# Patient Record
Sex: Female | Born: 1995 | Race: Black or African American | Hispanic: No | Marital: Single | Smoking: Current every day smoker
Health system: Southern US, Community
[De-identification: ages and names within clinical notes are randomized; demographics above are authoritative.]

## PROBLEM LIST (undated history)

## (undated) DIAGNOSIS — G43909 Migraine, unspecified, not intractable, without status migrainosus: Secondary | ICD-10-CM

## (undated) DIAGNOSIS — F419 Anxiety disorder, unspecified: Secondary | ICD-10-CM

## (undated) DIAGNOSIS — M419 Scoliosis, unspecified: Secondary | ICD-10-CM

## (undated) DIAGNOSIS — F32A Depression, unspecified: Secondary | ICD-10-CM

## (undated) HISTORY — PX: NO PAST SURGERIES: SHX2092

## (undated) HISTORY — DX: Migraine, unspecified, not intractable, without status migrainosus: G43.909

## (undated) HISTORY — DX: Depression, unspecified: F32.A

## (undated) HISTORY — DX: Anxiety disorder, unspecified: F41.9

## (undated) HISTORY — DX: Scoliosis, unspecified: M41.9

---
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
## 2014-07-07 ENCOUNTER — Emergency Department: Payer: Self-pay | Admitting: Emergency Medicine

## 2014-09-06 ENCOUNTER — Emergency Department: Payer: Self-pay | Admitting: Student

## 2014-10-23 ENCOUNTER — Emergency Department: Payer: Self-pay | Admitting: Emergency Medicine

## 2014-10-25 LAB — BETA STREP CULTURE(ARMC)

## 2015-01-17 ENCOUNTER — Emergency Department: Payer: Self-pay | Admitting: Emergency Medicine

## 2015-01-17 LAB — CBC WITH DIFFERENTIAL/PLATELET
Basophil #: 0 x10 3/mm 3
Basophil %: 0.4 %
Eosinophil #: 0.1 x10 3/mm 3
Eosinophil %: 1.2 %
HCT: 38.6 %
HGB: 12.5 g/dL
Lymphocyte %: 10.3 %
Lymphs Abs: 0.9 x10 3/mm 3 — ABNORMAL LOW
MCH: 27.1 pg
MCHC: 32.3 g/dL
MCV: 84 fL
Monocyte #: 0.3 "x10 3/mm "
Monocyte %: 3.1 %
Neutrophil #: 7.7 x10 3/mm 3 — ABNORMAL HIGH
Neutrophil %: 85 %
Platelet: 360 x10 3/mm 3
RBC: 4.61 X10 6/mm 3
RDW: 15.4 % — ABNORMAL HIGH
WBC: 9.1 x10 3/mm 3

## 2015-01-17 LAB — COMPREHENSIVE METABOLIC PANEL
ALT: 18 U/L (ref 14–63)
AST: 25 U/L (ref 0–26)
Albumin: 4.3 g/dL (ref 3.8–5.6)
Alkaline Phosphatase: 91 U/L (ref 46–116)
Anion Gap: 7 (ref 7–16)
BUN: 8 mg/dL (ref 7–18)
Bilirubin,Total: 0.2 mg/dL (ref 0.2–1.0)
CHLORIDE: 104 mmol/L (ref 98–107)
Calcium, Total: 9.3 mg/dL (ref 9.0–10.7)
Co2: 26 mmol/L (ref 21–32)
Creatinine: 0.85 mg/dL (ref 0.60–1.30)
GLUCOSE: 88 mg/dL (ref 65–99)
Osmolality: 272 (ref 275–301)
Potassium: 3.9 mmol/L (ref 3.5–5.1)
Sodium: 137 mmol/L (ref 136–145)
TOTAL PROTEIN: 8.6 g/dL (ref 6.4–8.6)

## 2015-01-17 LAB — URINALYSIS, COMPLETE
Bacteria: NONE SEEN
Bilirubin,UR: NEGATIVE
Glucose,UR: NEGATIVE mg/dL
Ketone: NEGATIVE
Leukocyte Esterase: NEGATIVE
Nitrite: NEGATIVE
Ph: 6
Protein: NEGATIVE
RBC,UR: 4 /HPF
Specific Gravity: 1.027
Squamous Epithelial: 1
WBC UR: 2 /HPF

## 2015-04-08 ENCOUNTER — Emergency Department
Admit: 2015-04-08 | Disposition: A | Discharge status: Home / Self Care | Payer: Self-pay | Admitting: Emergency Medicine

## 2015-04-08 LAB — COMPREHENSIVE METABOLIC PANEL
ALBUMIN: 4.2 g/dL
ANION GAP: 10 (ref 7–16)
Alkaline Phosphatase: 65 U/L
BUN: 8 mg/dL
Bilirubin,Total: 0.5 mg/dL
CALCIUM: 9.2 mg/dL
CHLORIDE: 101 mmol/L
CREATININE: 0.87 mg/dL
Co2: 26 mmol/L
Glucose: 155 mg/dL — ABNORMAL HIGH
POTASSIUM: 3.6 mmol/L
SGOT(AST): 18 U/L
SGPT (ALT): 10 U/L — ABNORMAL LOW
Sodium: 137 mmol/L
Total Protein: 8.4 g/dL — ABNORMAL HIGH

## 2015-04-08 LAB — CBC
HCT: 38.1 % (ref 35.0–47.0)
HGB: 12.2 g/dL (ref 12.0–16.0)
MCH: 26.8 pg (ref 26.0–34.0)
MCHC: 32 g/dL (ref 32.0–36.0)
MCV: 84 fL (ref 80–100)
PLATELETS: 332 10*3/uL (ref 150–440)
RBC: 4.54 10*6/uL (ref 3.80–5.20)
RDW: 14.8 % — ABNORMAL HIGH (ref 11.5–14.5)
WBC: 13.2 10*3/uL — ABNORMAL HIGH (ref 3.6–11.0)

## 2015-04-08 LAB — URINALYSIS, COMPLETE
BILIRUBIN, UR: NEGATIVE
Bacteria: NONE SEEN
Glucose,UR: NEGATIVE mg/dL (ref 0–75)
Nitrite: NEGATIVE
PH: 6 (ref 4.5–8.0)
Protein: 30
SPECIFIC GRAVITY: 1.017 (ref 1.003–1.030)

## 2015-04-08 LAB — HCG, QUANTITATIVE, PREGNANCY: Beta Hcg, Quant.: <1 m[IU]/mL

## 2015-12-12 ENCOUNTER — Emergency Department
Admission: EM | Admit: 2015-12-12 | Discharge: 2015-12-12 | Disposition: A | Discharge status: Home / Self Care | Payer: Medicaid Other | Attending: Student | Admitting: Student

## 2015-12-12 ENCOUNTER — Encounter: Payer: Self-pay | Admitting: Emergency Medicine

## 2015-12-12 DIAGNOSIS — F17210 Nicotine dependence, cigarettes, uncomplicated: Secondary | ICD-10-CM | POA: Insufficient documentation

## 2015-12-12 DIAGNOSIS — R1110 Vomiting, unspecified: Secondary | ICD-10-CM | POA: Insufficient documentation

## 2015-12-12 DIAGNOSIS — H6121 Impacted cerumen, right ear: Secondary | ICD-10-CM | POA: Diagnosis not present

## 2015-12-12 DIAGNOSIS — Z79899 Other long term (current) drug therapy: Secondary | ICD-10-CM | POA: Diagnosis not present

## 2015-12-12 DIAGNOSIS — H9201 Otalgia, right ear: Secondary | ICD-10-CM

## 2015-12-12 LAB — URINALYSIS COMPLETE WITH MICROSCOPIC (ARMC ONLY)
BILIRUBIN URINE: NEGATIVE
Bacteria, UA: NONE SEEN
Glucose, UA: NEGATIVE mg/dL
KETONES UR: NEGATIVE mg/dL
Leukocytes, UA: NEGATIVE
Nitrite: NEGATIVE
PH: 7 (ref 5.0–8.0)
PROTEIN: NEGATIVE mg/dL
Specific Gravity, Urine: 1.016 (ref 1.005–1.030)

## 2015-12-12 MED ORDER — NEOMYCIN-POLYMYXIN-HC 3.5-10000-1 OT SOLN: 3.0000 [drp] | Freq: Four times a day (QID) | OTIC | Status: DC

## 2015-12-12 MED ORDER — AMOXICILLIN 500 MG PO CAPS: 500.0000 mg | ORAL_CAPSULE | Freq: Three times a day (TID) | ORAL | Status: DC

## 2015-12-12 NOTE — ED Notes (Signed)
Pt given cup and aware of need for urine 

## 2015-12-12 NOTE — ED Notes (Signed)
Reminded pt of urine sample needed.  Requested she try to get one even though claims cannot go.

## 2015-12-12 NOTE — ED Notes (Signed)
Pt c/o right ear pain on and off X 1 week then constant since yesterday.  Also reports vomiting past 2 days but none today.

## 2015-12-12 NOTE — ED Provider Notes (Signed)
[facility] Emergency Department Provider Note  ____________________________________________  Time seen: Approximately 2:45 PM  I have reviewed the triage vital signs and the nursing notes.   HISTORY  Chief Complaint Otalgia and Emesis    HPI ASUNA PETH is a 19 y.o. female patient complaining of 1 week of right ear pain. Patient also complaining of vomiting for the last 2 days but none today.States she's been having flank pain. Patient also states increased frequency and urgency but no dysuria. Patient denies any fever or chills or vaginal discharge. Patient rating her pain discomfort as 8/10. No palliative measures taken for these complaints. Patient is currently on her menstrual cycle.  History reviewed. No pertinent past medical history.  Patient Active Problem List   Diagnosis Date Noted  . MENORRHAGIA 09/04/2010    Past Surgical History  Procedure Laterality Date  . No past surgeries      Current Outpatient Rx  Name  Route  Sig  Dispense  Refill  . medroxyPROGESTERone (DEPO-PROVERA) 150 MG/ML injection   Intramuscular   Inject 150 mg into the muscle every 3 (three) months.         . neomycin-polymyxin-hydrocortisone (CORTISPORIN) otic solution   Right Ear   Place 3 drops into the right ear 4 (four) times daily.   10 mL   0   . ondansetron (ZOFRAN-ODT) 4 MG disintegrating tablet   Oral   Take 1 tablet (4 mg total) by mouth every 8 (eight) hours as needed for nausea.   12 tablet   0     Allergies Review of patient's allergies indicates no known allergies.  Family History  Problem Relation Age of Onset  . Hearing loss Neg Hx     Social History Social History  Substance Use Topics  . Smoking status: Current Every Day Smoker -- 1 years    Types: Cigarettes  . Smokeless tobacco: Never Used  . Alcohol Use: No    Review of Systems Constitutional: No fever/chills Eyes: No visual changes. ENT: Right ear  pain Cardiovascular: Denies chest pain. Respiratory: Denies shortness of breath. Gastrointestinal: No abdominal pain.  No nausea, no vomiting.  No diarrhea.  No constipation. Genitourinary: Negative frequency Musculoskeletal: Flank pain  Skin: Negative for rash. Neurological: Negative for headaches, focal weakness or numbness. 10-point ROS otherwise negative.  ____________________________________________   PHYSICAL EXAM:  VITAL SIGNS: ED Triage Vitals  Enc Vitals Group     BP 12/12/15 1435 115/63 mmHg     Pulse Rate 12/12/15 1435 89     Resp 12/12/15 1435 16     Temp 12/12/15 1435 97.7 F (36.5 C)     Temp Source 12/12/15 1435 Oral     SpO2 12/12/15 1435 100 %     Weight 12/12/15 1435 110 lb (49.896 kg)     Height 12/12/15 1435  (1.499 m)     Head Cir --      Peak Flow --      Pain Score 12/12/15 1436 8     Pain Loc --      Pain Edu? --      Excl. in GC? --     Constitutional: Alert and oriented. Well appearing and in no acute distress. Eyes: Conjunctivae are normal. PERRL. EOMI. Head: Atraumatic. Nose: No congestion/rhinnorhea. Right ear canal full of cerumen. Mouth/Throat: Mucous membranes are moist.  Oropharynx non-erythematous. Neck: No stridor.  No cervical spine tenderness to palpation. Hematological/Lymphatic/Immunilogical: No cervical lymphadenopathy. Cardiovascular: Normal rate, regular rhythm.  Grossly normal heart sounds.  Good peripheral circulation. Respiratory: Normal respiratory effort.  No retractions. Lungs CTAB. Gastrointestinal: Soft and nontender. No distention. No abdominal bruits. No CVA tenderness. Genitourinary: Deferred Musculoskeletal: No spinal deformity. Nontender to palpation spinal process. She has decreased range of motion with flexion limited by complaining of pain. Patient has a negative straight leg test bilaterally. Neurologic:  Normal speech and language. No gross focal neurologic deficits are appreciated. No gait  instability. Skin:  Skin is warm, dry and intact. No rash noted. Psychiatric: Mood and affect are normal. Speech and behavior are normal.  ____________________________________________   LABS (all labs ordered are listed, but only abnormal results are displayed)  Labs Reviewed  URINALYSIS COMPLETEWITH MICROSCOPIC ([facility] ONLY)   ____________________________________________  EKG   ____________________________________________  RADIOLOGY   ____________________________________________   PROCEDURES  Procedure(s) performed: None  Critical Care performed: No  ____________________________________________   INITIAL IMPRESSION / ASSESSMENT AND PLAN / ED COURSE  Pertinent labs & imaging results that were available during my care of the patient were reviewed by me and considered in my medical decision making (see chart for details).  Otitis external: cerumen impaction was removed with curette. Canal is still is edematous. Patient given a prescription for Cortisporin eardrops and amoxicillin. Patient advised to take Tylenol or Motrin for pain. [facility] ____________________________________________   FINAL CLINICAL IMPRESSION(S) / ED DIAGNOSES  Final diagnoses:  Otalgia of right ear  Cerumen impaction, right      Joni ReiningRonald K Jericka Kadar, PA-C 12/12/15 1609  Gayla DossEryka A Gayle, MD 12/13/15 0730

## 2015-12-12 NOTE — Discharge Instructions (Signed)
Cerumen Impaction °The structures of the external ear canal secrete a waxy substance known as cerumen. Excess cerumen can build up in the ear canal, causing a condition known as cerumen impaction. Cerumen impaction can cause ear pain and disrupt the function of the ear. °The rate of cerumen production differs for each individual. In certain individuals, the configuration of the ear canal may decrease his or her ability to naturally remove cerumen. °CAUSES °Cerumen impaction is caused by excessive cerumen production or buildup. °RISK FACTORS °· Frequent use of swabs to clean ears. °· Having narrow ear canals. °· Having eczema. °· Being dehydrated. °SIGNS AND SYMPTOMS °· Diminished hearing. °· Ear drainage. °· Ear pain. °· Ear itch. °TREATMENT °Treatment may involve: °· Over-the-counter or prescription ear drops to soften the cerumen. °· Removal of cerumen by a health care provider. This may be done with: °· Irrigation with warm water. This is the most common method of removal. °· Ear curettes and other instruments. °· Surgery. This may be done in severe cases. °HOME CARE INSTRUCTIONS °· Take medicines only as directed by your health care provider. °· Do not insert objects into the ear with the intent of cleaning the ear. °PREVENTION °· Do not insert objects into the ear, even with the intent of cleaning the ear. Removing cerumen as a part of normal hygiene is not necessary, and the use of swabs in the ear canal is not recommended. °· Drink enough water to keep your urine clear or pale yellow. °· Control your eczema if you have it. °SEEK MEDICAL CARE IF: °· You develop ear pain. °· You develop bleeding from the ear. °· The cerumen does not clear after you use ear drops as directed. °  °This information is not intended to replace advice given to you by your health care provider. Make sure you discuss any questions you have with your health care provider. °  °Document Released: 01/15/2005 Document Revised: 12/29/2014  Document Reviewed: 07/25/2015 °Elsevier Interactive Patient Education ©2016 [employer] ° °Ear Drops, Adult °You need to put eardrops in your ear. °HOME CARE  °· Put drops in your affected ear as told. °· After putting in the drops, lie down with the ear you put the drops in facing up. Stay this way for 10 minutes. Use the ear drops as long as your doctor tells you. °· Before you get up, put a cotton ball gently in your ear. Do not push it far in your ear. °· Do not wash out your ears unless your doctor says it is okay. °· Finish all medicines as told by your doctor. You may be told to keep using the eardrops even if you start to feel better. °· See your doctor as told for follow-up visits. °GET HELP IF: °· You have pain that gets worse. °· Any unusual fluid (drainage) is coming from your ear (especially if the fluid stinks). °· You have trouble hearing. °· You get really dizzy as if the room is spinning and feel sick to your stomach (vertigo). °· The outside of your ear becomes red or puffy or both. This may be a sign of an allergic reaction. °MAKE SURE YOU:  °· Understand these instructions. °· Will watch your condition. °· Will get help right away if you are not doing well or get worse. °  °This information is not intended to replace advice given to you by your health care provider. Make sure you discuss any questions you have with your health care provider. °  °  Document Released: 05/28/2010 Document Revised: 12/29/2014 Document Reviewed: 07/05/2013 °Elsevier Interactive Patient Education ©2016 [employer] ° °

## 2016-03-31 ENCOUNTER — Emergency Department
Admission: EM | Admit: 2016-03-31 | Discharge: 2016-03-31 | Disposition: A | Discharge status: Home / Self Care | Payer: No Typology Code available for payment source | Attending: Emergency Medicine | Admitting: Emergency Medicine

## 2016-03-31 DIAGNOSIS — R197 Diarrhea, unspecified: Secondary | ICD-10-CM | POA: Insufficient documentation

## 2016-03-31 DIAGNOSIS — F17210 Nicotine dependence, cigarettes, uncomplicated: Secondary | ICD-10-CM | POA: Insufficient documentation

## 2016-03-31 DIAGNOSIS — N390 Urinary tract infection, site not specified: Secondary | ICD-10-CM | POA: Insufficient documentation

## 2016-03-31 DIAGNOSIS — R112 Nausea with vomiting, unspecified: Secondary | ICD-10-CM

## 2016-03-31 LAB — URINALYSIS COMPLETE WITH MICROSCOPIC (ARMC ONLY)
Bilirubin Urine: NEGATIVE
Glucose, UA: NEGATIVE mg/dL
NITRITE: POSITIVE — AB
PH: 5 (ref 5.0–8.0)
Protein, ur: 30 mg/dL — AB
SPECIFIC GRAVITY, URINE: 1.031 — AB (ref 1.005–1.030)

## 2016-03-31 LAB — COMPREHENSIVE METABOLIC PANEL
ALT: 12 U/L — AB (ref 14–54)
AST: 19 U/L (ref 15–41)
Albumin: 4.4 g/dL (ref 3.5–5.0)
Alkaline Phosphatase: 52 U/L (ref 38–126)
Anion gap: 4 — ABNORMAL LOW (ref 5–15)
BUN: 11 mg/dL (ref 6–20)
CHLORIDE: 106 mmol/L (ref 101–111)
CO2: 27 mmol/L (ref 22–32)
CREATININE: 0.8 mg/dL (ref 0.44–1.00)
Calcium: 9.3 mg/dL (ref 8.9–10.3)
Glucose, Bld: 93 mg/dL (ref 65–99)
POTASSIUM: 3.7 mmol/L (ref 3.5–5.1)
SODIUM: 137 mmol/L (ref 135–145)
Total Bilirubin: 0.4 mg/dL (ref 0.3–1.2)
Total Protein: 8 g/dL (ref 6.5–8.1)

## 2016-03-31 LAB — LIPASE, BLOOD: LIPASE: 20 U/L (ref 11–51)

## 2016-03-31 LAB — CBC
HCT: 38.7 % (ref 35.0–47.0)
Hemoglobin: 12.7 g/dL (ref 12.0–16.0)
MCH: 27.5 pg (ref 26.0–34.0)
MCHC: 32.9 g/dL (ref 32.0–36.0)
MCV: 83.6 fL (ref 80.0–100.0)
PLATELETS: 359 10*3/uL (ref 150–440)
RBC: 4.63 MIL/uL (ref 3.80–5.20)
RDW: 16.1 % — AB (ref 11.5–14.5)
WBC: 6.9 10*3/uL (ref 3.6–11.0)

## 2016-03-31 LAB — POCT PREGNANCY, URINE: PREG TEST UR: NEGATIVE

## 2016-03-31 MED ORDER — CEPHALEXIN 500 MG PO CAPS
500.0000 mg | ORAL_CAPSULE | Freq: Once | ORAL | Status: AC
  Administered 2016-03-31: 500 mg via ORAL
  Filled 2016-03-31: qty 1

## 2016-03-31 MED ORDER — SODIUM CHLORIDE 0.9 % IV BOLUS (SEPSIS)
1000.0000 mL | Freq: Once | INTRAVENOUS | Status: AC
Start: 2016-03-31 — End: 2016-03-31
  Administered 2016-03-31: 1000 mL via INTRAVENOUS

## 2016-03-31 MED ORDER — DIPHENOXYLATE-ATROPINE 2.5-0.025 MG PO TABS
1.0000 | ORAL_TABLET | Freq: Once | ORAL | Status: AC
  Administered 2016-03-31: 1 via ORAL
  Filled 2016-03-31: qty 1

## 2016-03-31 MED ORDER — LOPERAMIDE HCL 2 MG PO TABS: 2.0000 mg | ORAL_TABLET | Freq: Four times a day (QID) | ORAL | Status: DC | PRN

## 2016-03-31 MED ORDER — ONDANSETRON 4 MG PO TBDP: 4.0000 mg | ORAL_TABLET | Freq: Three times a day (TID) | ORAL | Status: DC | PRN

## 2016-03-31 MED ORDER — CEPHALEXIN 500 MG PO CAPS: 500.0000 mg | ORAL_CAPSULE | Freq: Four times a day (QID) | ORAL | Status: AC

## 2016-03-31 MED ORDER — ONDANSETRON HCL 4 MG/2ML IJ SOLN
4.0000 mg | Freq: Once | INTRAMUSCULAR | Status: AC
  Administered 2016-03-31: 4 mg via INTRAVENOUS
  Filled 2016-03-31: qty 2

## 2016-03-31 NOTE — ED Provider Notes (Signed)
[facility] Emergency Department Provider Note  ____________________________________________  Time seen: Approximately 7:57 AM  I have reviewed the triage vital signs and the nursing notes.   HISTORY  Chief Complaint Abdominal Pain    HPI Susan Conrad is a 20 y.o. female presenting for nausea, vomiting, diarrhea. Patient reports that since yesterday she has been having loose nonbloody stools associated with multiple episodes of nausea and vomiting. She denies any abdominal pain. She does have malodorous urine without pain or burning or frequency. No change in vaginal discharge. Patient also reports that she has had some vaginal spotting outside of her normal menstrual cycle.No cough or cold symptoms, no known sick contacts.  SH: Works at [employer]   History reviewed. No pertinent past medical history.  Patient Active Problem List   Diagnosis Date Noted  . MENORRHAGIA 09/04/2010    Past Surgical History  Procedure Laterality Date  . No past surgeries      Current Outpatient Rx  Name  Route  Sig  Dispense  Refill  . cephALEXin (KEFLEX) 500 MG capsule   Oral   Take 1 capsule (500 mg total) by mouth 4 (four) times daily.   20 capsule   0   . loperamide (IMODIUM A-D) 2 MG tablet   Oral   Take 1 tablet (2 mg total) by mouth 4 (four) times daily as needed for diarrhea or loose stools.   12 tablet   0   . ondansetron (ZOFRAN ODT) 4 MG disintegrating tablet   Oral   Take 1 tablet (4 mg total) by mouth every 8 (eight) hours as needed for nausea or vomiting.   20 tablet   0     Allergies Review of patient's allergies indicates no known allergies.  Family History  Problem Relation Age of Onset  . Hearing loss Neg Hx     Social History Social History  Substance Use Topics  . Smoking status: Current Every Day Smoker -- 1 years    Types: Cigarettes  . Smokeless tobacco: Never Used  . Alcohol Use: No    Review of  Systems Constitutional: No fever/chills. No lightheadedness or syncope. Eyes: No visual changes. ENT: No sore throat. No congestion or rhinorrhea. Cardiovascular: Denies chest pain. Denies palpitations. Respiratory: Denies shortness of breath.  No cough. Gastrointestinal: No abdominal pain.  Positive nausea, positive vomiting.  No diarrhea.  No constipation. Genitourinary: Negative for dysuria.  Positive malodorous urine. Negative change in vaginal discharge. Musculoskeletal: Negative for back pain. Skin: Negative for rash. Neurological: Negative for headaches. No focal numbness, tingling or weakness.   10-point ROS otherwise negative.  ____________________________________________   PHYSICAL EXAM:  VITAL SIGNS: ED Triage Vitals  Enc Vitals Group     BP 03/31/16 0740 118/68 mmHg     Pulse Rate 03/31/16 0740 68     Resp 03/31/16 0740 16     Temp 03/31/16 0740 97.6 F (36.4 C)     Temp Source 03/31/16 0740 Oral     SpO2 03/31/16 0740 100 %     Weight 03/31/16 0740 105 lb (47.628 kg)     Height 03/31/16 0740 5' (1.524 m)     Head Cir --      Peak Flow --      Pain Score --      Pain Loc --      Pain Edu? --      Excl. in GC? --     Constitutional: Alert and oriented. Well appearing  and in no acute distress. Answers questions appropriately. Eyes: Conjunctivae are normal.  EOMI. No scleral icterus. Head: Atraumatic. Nose: No congestion/rhinnorhea. Mouth/Throat: Mucous membranes are Mildly dry.  Neck: No stridor.  Supple.   Cardiovascular: Normal rate, regular rhythm. No murmurs, rubs or gallops.  Respiratory: Normal respiratory effort.  No accessory muscle use or retractions. Lungs CTAB.  No wheezes, rales or ronchi. Gastrointestinal: Soft, nontender and nondistended.  No guarding or rebound.  No peritoneal signs. Musculoskeletal: No LE edema. No ttp in the calves or palpable cords.  Negative Homan's sign. Neurologic:  A&Ox3.  Speech is clear.  Face and smile are symmetric.   EOMI.  Moves all extremities well. Skin:  Skin is warm, dry and intact. No rash noted. Psychiatric: Mood and affect are normal. Speech and behavior are normal.  Normal judgement.  ____________________________________________   LABS (all labs ordered are listed, but only abnormal results are displayed)  Labs Reviewed  URINALYSIS COMPLETEWITH MICROSCOPIC ([facility] ONLY) - Abnormal; Notable for the following:    Color, Urine YELLOW (*)    APPearance CLOUDY (*)    Ketones, ur TRACE (*)    Specific Gravity, Urine 1.031 (*)    Hgb urine dipstick 1+ (*)    Protein, ur 30 (*)    Nitrite POSITIVE (*)    Leukocytes, UA TRACE (*)    Bacteria, UA MANY (*)    Squamous Epithelial / LPF 6-30 (*)    All other components within normal limits  CBC - Abnormal; Notable for the following:    RDW 16.1 (*)    All other components within normal limits  COMPREHENSIVE METABOLIC PANEL - Abnormal; Notable for the following:    ALT 12 (*)    Anion gap 4 (*)    All other components within normal limits  LIPASE, BLOOD  POC URINE PREG, ED  POCT PREGNANCY, URINE   ____________________________________________  EKG  Not indicated ____________________________________________  RADIOLOGY  No results found.  ____________________________________________   PROCEDURES  Procedure(s) performed: None  Critical Care performed: No ____________________________________________   INITIAL IMPRESSION / ASSESSMENT AND PLAN / ED COURSE  Pertinent labs & imaging results that were available during my care of the patient were reviewed by me and considered in my medical decision making (see chart for details).  20 y.o. female presenting with 2 days of nausea vomiting and loose stool without abdominal pain. Overall, the patient has reassuring vital signs and an abdominal exam which does not show any focal abnormalities. She may have a viral GI illness or foodborne illness. I'll also check her for UTI, as her nausea  and vomiting may be related to infection. Will rule out pregnancy. I will initiate symptomatically treatment and reevaluate the patient for final disposition.  ----------------------------------------- 8:43 AM on 03/31/2016 -----------------------------------------  The patient is feeling much better and is able to tolerate liquid by mouth. Her UA does show urinary tract infection's I'll give her a first dose of antibiotics here and discharge her home with oral antibiotics and instructions for close PMD follow-up. She understands return precautions as well as follow-up instructions.  ____________________________________________  FINAL CLINICAL IMPRESSION(S) / ED DIAGNOSES  Final diagnoses:  UTI (lower urinary tract infection)  Nausea vomiting and diarrhea      NEW MEDICATIONS STARTED DURING THIS VISIT:  New Prescriptions   CEPHALEXIN (KEFLEX) 500 MG CAPSULE    Take 1 capsule (500 mg total) by mouth 4 (four) times daily.   LOPERAMIDE (IMODIUM A-D) 2 MG TABLET  Take 1 tablet (2 mg total) by mouth 4 (four) times daily as needed for diarrhea or loose stools.   ONDANSETRON (ZOFRAN ODT) 4 MG DISINTEGRATING TABLET    Take 1 tablet (4 mg total) by mouth every 8 (eight) hours as needed for nausea or vomiting.     Rockne MenghiniAnne-Caroline Myna Freimark, MD 03/31/16 [phone]

## 2016-03-31 NOTE — Discharge Instructions (Signed)
Please take a clear liquid diet for the next 24 hours, then advance to a bland BRAT diet as described.  Please take the entire course of antibiotics, even if you are feeling better.  Antibiotics may decrease the effectiveness of birth control, so use condoms when you are on antibiotics.  Return to the emergency department for severe pain, inability to keep down fluids, lightheadedness or fainting, or any other symptoms concerning to you.

## 2016-03-31 NOTE — ED Notes (Signed)
Pt states lower back pain and some nausea, vomiting, and diherra, last episode last night, states she is loosing weight and also having vaginal spotting, pt awake and alert in no acute distress, denies any abd pain

## 2016-03-31 NOTE — ED Notes (Signed)
Pt c/o N/V/D since yesterday. Pt also c/o having her period for the second time this month.

## 2016-04-26 ENCOUNTER — Emergency Department
Admission: EM | Admit: 2016-04-26 | Discharge: 2016-04-27 | Disposition: A | Discharge status: Home / Self Care | Payer: Self-pay | Attending: Emergency Medicine | Admitting: Emergency Medicine

## 2016-04-26 ENCOUNTER — Encounter: Payer: Self-pay | Admitting: Emergency Medicine

## 2016-04-26 DIAGNOSIS — G43019 Migraine without aura, intractable, without status migrainosus: Secondary | ICD-10-CM | POA: Insufficient documentation

## 2016-04-26 DIAGNOSIS — F17210 Nicotine dependence, cigarettes, uncomplicated: Secondary | ICD-10-CM | POA: Insufficient documentation

## 2016-04-26 MED ORDER — KETOROLAC TROMETHAMINE 30 MG/ML IJ SOLN
30.0000 mg | Freq: Once | INTRAMUSCULAR | Status: AC
  Administered 2016-04-26: 30 mg via INTRAMUSCULAR
  Filled 2016-04-26: qty 1

## 2016-04-26 NOTE — ED Notes (Signed)
Pt reports pain to the right side of her head intermittently for 2 to 3 weeks. States she first thought it was her ear but the realized it was not. Pt does reports some sinus congestion over the last few weeks. Pt also reports she has a place at the bot tome of her left buttock that is itching. Noted dry patch of skin to the area.

## 2016-04-26 NOTE — ED Notes (Signed)
Pt here with 2 complaints; c/o intermittent headache to the right side of her head for 2-3 weeks; also c/o small abscess along right buttock/underwear line; area tender;

## 2016-04-26 NOTE — ED Provider Notes (Signed)
[facility] Emergency Department Provider Note  ____________________________________________  Time seen: Approximately 10:42 PM  I have reviewed the triage vital signs and the nursing notes.   HISTORY  Chief Complaint Headache    HPI Susan Conrad is a 20 y.o. female complaining of right sided headache and a lesion on right buttocks. Patient states the headache began 2-3 weeks ago is intermittentand a dull throbbing pain. She admits to blurry vision "every now and then" and phonophobia. Denies photophobia, nausea, vomiting, diarrhea, numbness or tingling in the extremities. The lesion on the buttocks occurred approximately one week ago. She first noticed it when it began to itch and says it progressed to a "boil" within a day. Since then it seems to have resolved though the itching continues to bother her. She denies drainage but admits to continuing tenderness around the area.    History reviewed. No pertinent past medical history.  Patient Active Problem List   Diagnosis Date Noted  . MENORRHAGIA 09/04/2010    Past Surgical History  Procedure Laterality Date  . No past surgeries      Current Outpatient Rx  Name  Route  Sig  Dispense  Refill  . loperamide (IMODIUM A-D) 2 MG tablet   Oral   Take 1 tablet (2 mg total) by mouth 4 (four) times daily as needed for diarrhea or loose stools.   12 tablet   0   . ondansetron (ZOFRAN ODT) 4 MG disintegrating tablet   Oral   Take 1 tablet (4 mg total) by mouth every 8 (eight) hours as needed for nausea or vomiting.   20 tablet   0     Allergies Review of patient's allergies indicates no known allergies.  Family History  Problem Relation Age of Onset  . Hearing loss Neg Hx     Social History Social History  Substance Use Topics  . Smoking status: Current Every Day Smoker -- 1 years    Types: Cigarettes  . Smokeless tobacco: Never Used  . Alcohol Use: No     Review of Systems   Constitutional: No fever/chills Eyes: Blurry vision ENT: No upper respiratory complaints. Cardiovascular: no chest pain. Respiratory: no cough. No SOB. Gastrointestinal: No abdominal pain.  No nausea, no vomiting.  No diarrhea.  No constipation. *Genitourinary: Negative for dysuria. No hematuria Musculoskeletal: Negative for musculoskeletal pain. Skin: Negative for rash, abrasions, lacerations, ecchymosis. Pruritic and tender lesion on the right buttocks Neurological: Negative for focal weakness or numbness. Positive for right sided HA 10-point ROS otherwise negative.  ____________________________________________   PHYSICAL EXAM:  VITAL SIGNS: ED Triage Vitals  Enc Vitals Group     BP 04/26/16 2122 114/68 mmHg     Pulse Rate 04/26/16 2122 66     Resp 04/26/16 2122 18     Temp 04/26/16 2122 98.5 F (36.9 C)     Temp Source 04/26/16 2122 Oral     SpO2 04/26/16 2122 100 %     Weight 04/26/16 2122 104 lb 9 oz (47.429 kg)     Height 04/26/16 2122 5' (1.524 m)     Head Cir --      Peak Flow --      Pain Score 04/26/16 2128 9     Pain Loc --      Pain Edu? --      Excl. in GC? --      Constitutional: Alert and oriented. Well appearing and in no acute distress. Eyes: Conjunctivae are normal.  PERRL. EOMI. Head: Atraumatic. Neck: No stridor.  Cardiovascular: Good peripheral circulation. Respiratory: Normal respiratory effort without tachypnea or retractions. Lungs CTAB.  Musculoskeletal: Full range of motion to all extremities. No gross deformities appreciated. Neurologic:  Normal speech and language. No gross focal neurologic deficits are appreciated. CNII-VII grossly intact Skin:  Skin is warm, dry and intact. No rash noted. Psychiatric: Mood and affect are normal. Speech and behavior are normal. Patient exhibits appropriate insight and judgement.   ____________________________________________   LABS (all labs ordered are listed, but only abnormal results are  displayed)  Labs Reviewed - No data to display ____________________________________________  EKG   ____________________________________________  RADIOLOGY   No results found.  ____________________________________________    PROCEDURES  Procedure(s) performed:       Medications  ketorolac (TORADOL) 30 MG/ML injection 30 mg (30 mg Intramuscular Given 04/26/16 2325)     ____________________________________________   INITIAL IMPRESSION / ASSESSMENT AND PLAN / ED COURSE  Pertinent labs & imaging results that were available during my care of the patient were reviewed by me and considered in my medical decision making (see chart for details).  Patient's diagnosis is consistent with migraine without aura. Patient was given Toradol here in the emergency department with good relief. Patient reports good improvement of symptoms and is ready to go home. [facility] or otherwise directed. Patient is given ED precautions to return to the ED for any worsening or new symptoms.     ____________________________________________  FINAL CLINICAL IMPRESSION(S) / ED DIAGNOSES  Final diagnoses:  Intractable migraine without aura and without status migrainosus      NEW MEDICATIONS STARTED DURING THIS VISIT:  New Prescriptions   No medications on file        This chart was dictated using voice recognition software/Dragon. Despite best efforts to proofread, errors can occur which can change the meaning. Any change was purely unintentional.   Racheal PatchesJonathan D Braya Habermehl, PA-C 04/27/16 0013  Emily FilbertJonathan E Williams, MD 04/29/16 [phone]

## 2016-04-27 NOTE — Discharge Instructions (Signed)

## 2016-07-16 LAB — HM PAP SMEAR: HM Pap smear: NEGATIVE

## 2016-09-15 ENCOUNTER — Encounter: Payer: Self-pay | Admitting: Emergency Medicine

## 2016-09-15 ENCOUNTER — Emergency Department
Admission: EM | Admit: 2016-09-15 | Discharge: 2016-09-15 | Disposition: A | Discharge status: Home / Self Care | Payer: Medicaid Other | Attending: Emergency Medicine | Admitting: Emergency Medicine

## 2016-09-15 DIAGNOSIS — R112 Nausea with vomiting, unspecified: Secondary | ICD-10-CM

## 2016-09-15 DIAGNOSIS — F17210 Nicotine dependence, cigarettes, uncomplicated: Secondary | ICD-10-CM | POA: Insufficient documentation

## 2016-09-15 DIAGNOSIS — R197 Diarrhea, unspecified: Secondary | ICD-10-CM | POA: Insufficient documentation

## 2016-09-15 LAB — URINALYSIS COMPLETE WITH MICROSCOPIC (ARMC ONLY)
Bacteria, UA: NONE SEEN
Bilirubin Urine: NEGATIVE
Glucose, UA: NEGATIVE mg/dL
Ketones, ur: NEGATIVE mg/dL
Leukocytes, UA: NEGATIVE
Nitrite: NEGATIVE
Protein, ur: 30 mg/dL — AB
Specific Gravity, Urine: 1.028 (ref 1.005–1.030)
pH: 5 (ref 5.0–8.0)

## 2016-09-15 LAB — POCT PREGNANCY, URINE: Preg Test, Ur: NEGATIVE

## 2016-09-15 LAB — COMPREHENSIVE METABOLIC PANEL
ALT: 14 U/L (ref 14–54)
AST: 20 U/L (ref 15–41)
Albumin: 4.9 g/dL (ref 3.5–5.0)
Alkaline Phosphatase: 43 U/L (ref 38–126)
Anion gap: 7 (ref 5–15)
BILIRUBIN TOTAL: 0.6 mg/dL (ref 0.3–1.2)
BUN: 8 mg/dL (ref 6–20)
CALCIUM: 9.8 mg/dL (ref 8.9–10.3)
CO2: 27 mmol/L (ref 22–32)
CREATININE: 0.85 mg/dL (ref 0.44–1.00)
Chloride: 104 mmol/L (ref 101–111)
GFR calc Af Amer: >60 mL/min (ref >60)
Glucose, Bld: 78 mg/dL (ref 65–99)
Potassium: 3.6 mmol/L (ref 3.5–5.1)
Sodium: 138 mmol/L (ref 135–145)
TOTAL PROTEIN: 8.7 g/dL — AB (ref 6.5–8.1)

## 2016-09-15 LAB — CBC
HCT: 42.3 % (ref 35.0–47.0)
Hemoglobin: 14.2 g/dL (ref 12.0–16.0)
MCH: 28.8 pg (ref 26.0–34.0)
MCHC: 33.6 g/dL (ref 32.0–36.0)
MCV: 85.5 fL (ref 80.0–100.0)
Platelets: 312 10*3/uL (ref 150–440)
RBC: 4.95 MIL/uL (ref 3.80–5.20)
RDW: 15.1 % — ABNORMAL HIGH (ref 11.5–14.5)
WBC: 7.6 10*3/uL (ref 3.6–11.0)

## 2016-09-15 LAB — LIPASE, BLOOD: Lipase: 33 U/L (ref 11–51)

## 2016-09-15 MED ORDER — ONDANSETRON HCL 4 MG/2ML IJ SOLN
INTRAMUSCULAR | Status: AC
  Filled 2016-09-15: qty 2

## 2016-09-15 MED ORDER — SODIUM CHLORIDE 0.9 % IV BOLUS (SEPSIS)
1000.0000 mL | Freq: Once | INTRAVENOUS | Status: AC
  Administered 2016-09-15: 1000 mL via INTRAVENOUS

## 2016-09-15 MED ORDER — ONDANSETRON HCL 4 MG PO TABS: 4.0000 mg | ORAL_TABLET | Freq: Three times a day (TID) | ORAL | 0 refills | Status: DC | PRN

## 2016-09-15 MED ORDER — ONDANSETRON HCL 4 MG/2ML IJ SOLN
4.0000 mg | Freq: Once | INTRAMUSCULAR | Status: AC
  Administered 2016-09-15: 4 mg via INTRAVENOUS

## 2016-09-15 NOTE — ED Notes (Signed)
Pt tolerated PO challenge, denies nausea. MD notified.

## 2016-09-15 NOTE — ED Triage Notes (Signed)
C/O lower abdominal pain, vomiting and diarrhea (since last night) and vaginal bleeding x 2 days.

## 2016-09-15 NOTE — ED Provider Notes (Signed)
[facility] Emergency Department Provider Note  ____________________________________________   I have reviewed the triage vital signs and the nursing notes.   HISTORY  Chief Complaint Abdominal Pain    HPI Susan Conrad is a 20 y.o. female patient has had nausea vomiting diarrhea since yesterday. Nonbloody nonbilious. No melena no bright red blood per rectum. Occasional cramping abdominal pain but none at this time. Is also beginning her menstrual period and this is the normal time for her. Has had spotting. No other symptoms. Denies pregnancy.She feels mildly dehydrated and would like a work note      History reviewed. No pertinent past medical history.  Patient Active Problem List   Diagnosis Date Noted  . MENORRHAGIA 09/04/2010    Past Surgical History:  Procedure Laterality Date  . NO PAST SURGERIES      Prior to Admission medications   Medication Sig Start Date End Date Taking? Authorizing Provider  loperamide (IMODIUM A-D) 2 MG tablet Take 1 tablet (2 mg total) by mouth 4 (four) times daily as needed for diarrhea or loose stools. 03/31/16   Anne-Caroline Sharma Covert, MD  ondansetron (ZOFRAN ODT) 4 MG disintegrating tablet Take 1 tablet (4 mg total) by mouth every 8 (eight) hours as needed for nausea or vomiting. 03/31/16   Rockne Menghini, MD    Allergies Review of patient's allergies indicates no known allergies.  Family History  Problem Relation Age of Onset  . Hearing loss Neg Hx     Social History Social History  Substance Use Topics  . Smoking status: Current Every Day Smoker    Years: 1.00    Types: Cigarettes  . Smokeless tobacco: Never Used  . Alcohol use No    Review of Systems }Constitutional: No fever/chills Eyes: No visual changes. ENT: No sore throat. No stiff neck no neck pain Cardiovascular: Denies chest pain. Respiratory: Denies shortness of breath. Gastrointestinal: See history of present  illness Genitourinary: Negative for dysuria. Musculoskeletal: Negative lower extremity swelling Skin: Negative for rash. Neurological: Negative for severe headaches, focal weakness or numbness. 10-point ROS otherwise negative.  ____________________________________________   PHYSICAL EXAM:  VITAL SIGNS: ED Triage Vitals  Enc Vitals Group     BP 09/15/16 1624 119/78     Pulse Rate 09/15/16 1624 99     Resp 09/15/16 1624 16     Temp 09/15/16 1624 98.6 F (37 C)     Temp Source 09/15/16 1624 Oral     SpO2 09/15/16 1624 100 %     Weight 09/15/16 1623 107 lb (48.5 kg)     Height 09/15/16 1623 5' (1.524 m)     Head Circumference --      Peak Flow --      Pain Score 09/15/16 1624 8     Pain Loc --      Pain Edu? --      Excl. in GC? --     Constitutional: Alert and oriented. Well appearing and in no acute distress. Eyes: Conjunctivae are normal. PERRL. EOMI. Head: Atraumatic. Nose: No congestion/rhinnorhea. Mouth/Throat: Mucous membranes are moist.  Oropharynx non-erythematous. Neck: No stridor.   Nontender with no meningismus Cardiovascular: Normal rate, regular rhythm. Grossly normal heart sounds.  Good peripheral circulation. Respiratory: Normal respiratory effort.  No retractions. Lungs CTAB. Abdominal: Soft and nontender. No distention. No guarding no rebound Back:  There is no focal tenderness or step off.  there is no midline tenderness there are no lesions noted. there is no CVA tenderness  Musculoskeletal: No lower extremity tenderness, no upper extremity tenderness. No joint effusions, no DVT signs strong distal pulses no edema Neurologic:  Normal speech and language. No gross focal neurologic deficits are appreciated.  Skin:  Skin is warm, dry and intact. No rash noted. Psychiatric: Mood and affect are normal. Speech and behavior are normal.  ____________________________________________   LABS (all labs ordered are listed, but only abnormal results are  displayed)  Labs Reviewed  COMPREHENSIVE METABOLIC PANEL - Abnormal; Notable for the following:       Result Value   Total Protein 8.7 (*)    All other components within normal limits  CBC - Abnormal; Notable for the following:    RDW 15.1 (*)    All other components within normal limits  URINALYSIS COMPLETEWITH MICROSCOPIC ([facility] ONLY) - Abnormal; Notable for the following:    Color, Urine YELLOW (*)    APPearance CLEAR (*)    Hgb urine dipstick 3+ (*)    Protein, ur 30 (*)    Squamous Epithelial / LPF 0-5 (*)    All other components within normal limits  LIPASE, BLOOD  POC URINE PREG, ED  POCT PREGNANCY, URINE   ____________________________________________  EKG  I personally interpreted any EKGs ordered by me or triage  ____________________________________________  RADIOLOGY  I reviewed any imaging ordered by me or triage that were performed during my shift and, if possible, patient and/or family made aware of any abnormal findings. ____________________________________________   PROCEDURES  Procedure(s) performed: None  Procedures  Critical Care performed: None  ____________________________________________   INITIAL IMPRESSION / ASSESSMENT AND PLAN / ED COURSE  Pertinent labs & imaging results that were available during my care of the patient were reviewed by me and considered in my medical decision making (see chart for details).  Very well-appearing young woman with nausea vomiting diarrhea. She does appear to be somewhat depleted but not toxic and her abdomen is benign. We'll give her IV fluids antiemetics and reassessed.  Clinical Course   ____________________________________________   FINAL CLINICAL IMPRESSION(S) / ED DIAGNOSES  Final diagnoses:  None      This chart was dictated using voice recognition software.  Despite best efforts to proofread,  errors can occur which can change meaning.      Jeanmarie PlantJames A Davarius Ridener, MD 09/15/16 [phone]

## 2016-09-15 NOTE — ED Notes (Signed)

## 2017-02-15 ENCOUNTER — Emergency Department
Admission: EM | Admit: 2017-02-15 | Discharge: 2017-02-15 | Disposition: A | Discharge status: Home / Self Care | Payer: Medicaid Other | Attending: Emergency Medicine | Admitting: Emergency Medicine

## 2017-02-15 DIAGNOSIS — R197 Diarrhea, unspecified: Secondary | ICD-10-CM | POA: Insufficient documentation

## 2017-02-15 DIAGNOSIS — Z79899 Other long term (current) drug therapy: Secondary | ICD-10-CM | POA: Insufficient documentation

## 2017-02-15 DIAGNOSIS — F17210 Nicotine dependence, cigarettes, uncomplicated: Secondary | ICD-10-CM | POA: Insufficient documentation

## 2017-02-15 MED ORDER — LOPERAMIDE HCL 2 MG PO TABS: 2.0000 mg | ORAL_TABLET | Freq: Four times a day (QID) | ORAL | 0 refills | Status: DC | PRN

## 2017-02-15 MED ORDER — ONDANSETRON 4 MG PO TBDP: 4.0000 mg | ORAL_TABLET | Freq: Three times a day (TID) | ORAL | 0 refills | Status: DC | PRN

## 2017-02-15 MED ORDER — ONDANSETRON 4 MG PO TBDP
ORAL_TABLET | ORAL | Status: AC
  Administered 2017-02-15: 4 mg
  Filled 2017-02-15: qty 1

## 2017-02-15 NOTE — ED Triage Notes (Signed)
Pt states that she has been having diarrhea all week, pt states that she is also having a menstral cycle and reports that this is abnormal due to her being on depo and she isn't due for another month for another injection, pt also reports a bad pain that shoots through her head

## 2017-02-15 NOTE — ED Notes (Signed)
Patient states she is concerned that when she sits on the commode to have a BM and is there for a prolonged amount of time that her left leg becomes numb. Patient states the feeling does come back. Patient states this has happened for a "long time."

## 2017-02-15 NOTE — ED Provider Notes (Signed)
[facility] Emergency Department Provider Note   ____________________________________________    I have reviewed the triage vital signs and the nursing notes.   HISTORY  Chief Complaint Diarrhea     HPI Susan Conrad is a 21 y.o. female who presents with complaints of diarrhea. Patient reports over the last 5 days she had nausea vomiting and diarrhea. Nausea and vomiting resolved yesterday and she was able to eat at [employer] today. She can use to have mild watery diarrhea. She has not taken anything for this. No fevers or chills. No recent travel. No recent camping.She denies abdominal pain   No past medical history on file.  Patient Active Problem List   Diagnosis Date Noted  . MENORRHAGIA 09/04/2010    Past Surgical History:  Procedure Laterality Date  . NO PAST SURGERIES      Prior to Admission medications   Medication Sig Start Date End Date Taking? Authorizing Provider  loperamide (IMODIUM A-D) 2 MG tablet Take 1 tablet (2 mg total) by mouth 4 (four) times daily as needed for diarrhea or loose stools. 02/15/17   Jene Every, MD  ondansetron (ZOFRAN ODT) 4 MG disintegrating tablet Take 1 tablet (4 mg total) by mouth every 8 (eight) hours as needed for nausea or vomiting. 02/15/17   Jene Every, MD  ondansetron (ZOFRAN) 4 MG tablet Take 1 tablet (4 mg total) by mouth every 8 (eight) hours as needed for nausea or vomiting. 09/15/16   Jeanmarie Plant, MD     Allergies Patient has no known allergies.  Family History  Problem Relation Age of Onset  . Hearing loss Neg Hx     Social History Social History  Substance Use Topics  . Smoking status: Current Every Day Smoker    Years: 1.00    Types: Cigarettes  . Smokeless tobacco: Never Used  . Alcohol use No    Review of Systems  Constitutional: No fever/chills Eyes: No visual changes.  ENT: No sore throat. Cardiovascular: Denies chest pain. Respiratory: Denies shortness of  breath. Gastrointestinal: No abdominal pain.  No nausea, no vomiting.   Genitourinary: Negative for dysuria. Musculoskeletal: Negative for back pain. Skin: Negative for rash. Neurological: Negative for headaches or weakness  10-point ROS otherwise negative.  ____________________________________________   PHYSICAL EXAM:  VITAL SIGNS: ED Triage Vitals  Enc Vitals Group     BP 02/15/17 2106 111/66     Pulse Rate 02/15/17 2106 82     Resp 02/15/17 2106 16     Temp 02/15/17 2106 98.6 F (37 C)     Temp src --      SpO2 02/15/17 2106 100 %     Weight 02/15/17 2107 114 lb (51.7 kg)     Height 02/15/17 2107 5' (1.524 m)     Head Circumference --      Peak Flow --      Pain Score --      Pain Loc --      Pain Edu? --      Excl. in GC? --     Constitutional: Alert and oriented. No acute distress. Pleasant and interactive Eyes: Conjunctivae are normal.   Nose: No congestion/rhinnorhea. Mouth/Throat: Mucous membranes are moist.    Cardiovascular: Normal rate, regular rhythm. Grossly normal heart sounds.  Good peripheral circulation. Respiratory: Normal respiratory effort.  No retractions. Lungs CTAB. Gastrointestinal: Soft and nontender. No distention.  No CVA tenderness. Genitourinary: deferred Musculoskeletal:   Warm and well perfused  Neurologic:  Normal speech and language. No gross focal neurologic deficits are appreciated.  Skin:  Skin is warm, dry and intact. No rash noted. Psychiatric: Mood and affect are normal. Speech and behavior are normal.  ____________________________________________   LABS (all labs ordered are listed, but only abnormal results are displayed)  Labs Reviewed - No data to display ____________________________________________  EKG  None ____________________________________________  RADIOLOGY  None ____________________________________________   PROCEDURES  Procedure(s) performed: No    Critical Care performed:  No ____________________________________________   INITIAL IMPRESSION / ASSESSMENT AND PLAN / ED COURSE  Pertinent labs & imaging results that were available during my care of the patient were reviewed by me and considered in my medical decision making (see chart for details).  Patient presents with likely resolving gastroenteritis. She was able to tolerate a heavy meal today but continues to have mild diarrhea. We will treat with Imodium symptomatically. Currently she feels quite well and has no complaints    Prior to discharge patient had an episode of vomiting, we will treat with Zofran. I counseled her on Brat diet ____________________________________________   FINAL CLINICAL IMPRESSION(S) / ED DIAGNOSES  Final diagnoses:  Diarrhea, unspecified type      NEW MEDICATIONS STARTED DURING THIS VISIT:  Discharge Medication List as of 02/15/2017  9:47 PM       Note:  This document was prepared using Dragon voice recognition software and may include unintentional dictation errors.    Jene Everyobert Vasco Chong, MD 02/15/17 [phone]

## 2017-03-03 ENCOUNTER — Encounter: Payer: Self-pay | Admitting: Emergency Medicine

## 2017-03-03 ENCOUNTER — Emergency Department
Admission: EM | Admit: 2017-03-03 | Discharge: 2017-03-03 | Disposition: A | Discharge status: Home / Self Care | Payer: Medicaid Other | Attending: Emergency Medicine | Admitting: Emergency Medicine

## 2017-03-03 ENCOUNTER — Emergency Department: Payer: Medicaid Other

## 2017-03-03 DIAGNOSIS — W19XXXA Unspecified fall, initial encounter: Secondary | ICD-10-CM | POA: Insufficient documentation

## 2017-03-03 DIAGNOSIS — M79642 Pain in left hand: Secondary | ICD-10-CM

## 2017-03-03 DIAGNOSIS — Y929 Unspecified place or not applicable: Secondary | ICD-10-CM | POA: Insufficient documentation

## 2017-03-03 DIAGNOSIS — Y999 Unspecified external cause status: Secondary | ICD-10-CM | POA: Insufficient documentation

## 2017-03-03 DIAGNOSIS — Y939 Activity, unspecified: Secondary | ICD-10-CM | POA: Insufficient documentation

## 2017-03-03 DIAGNOSIS — F17210 Nicotine dependence, cigarettes, uncomplicated: Secondary | ICD-10-CM | POA: Insufficient documentation

## 2017-03-03 DIAGNOSIS — S6392XA Sprain of unspecified part of left wrist and hand, initial encounter: Secondary | ICD-10-CM | POA: Insufficient documentation

## 2017-03-03 MED ORDER — IBUPROFEN 600 MG PO TABS
600.0000 mg | ORAL_TABLET | Freq: Once | ORAL | Status: AC
  Administered 2017-03-03: 600 mg via ORAL
  Filled 2017-03-03: qty 1

## 2017-03-03 MED ORDER — TRAMADOL HCL 50 MG PO TABS: 50.0000 mg | ORAL_TABLET | Freq: Four times a day (QID) | ORAL | 0 refills | Status: DC | PRN

## 2017-03-03 MED ORDER — IBUPROFEN 600 MG PO TABS: 600.0000 mg | ORAL_TABLET | Freq: Three times a day (TID) | ORAL | 0 refills | Status: DC | PRN

## 2017-03-03 MED ORDER — TRAMADOL HCL 50 MG PO TABS
50.0000 mg | ORAL_TABLET | Freq: Once | ORAL | Status: AC
  Administered 2017-03-03: 50 mg via ORAL
  Filled 2017-03-03: qty 1

## 2017-03-03 NOTE — Discharge Instructions (Signed)
Hand splint for 3-5 days as needed. °

## 2017-03-03 NOTE — ED Triage Notes (Signed)
Pt to ed with c/o left hand pain and swelling after falling onto it today.

## 2017-03-03 NOTE — ED Provider Notes (Signed)
[facility] Emergency Department Provider Note   ____________________________________________   First MD Initiated Contact with Patient 03/03/17 1458     (approximate)  I have reviewed the triage vital signs and the nursing notes.   HISTORY  Chief Complaint Hand Pain    HPI Susan Conrad is a 21 y.o. female patient complaining of left hand pain and swelling secondary to a fall today. Patient rates pain as a 9/10. No palliative measures for this complaint.Patient describes pain as "achy". Patient is right-hand dominant.   History reviewed. No pertinent past medical history.  Patient Active Problem List   Diagnosis Date Noted  . MENORRHAGIA 09/04/2010    Past Surgical History:  Procedure Laterality Date  . NO PAST SURGERIES      Prior to Admission medications   Medication Sig Start Date End Date Taking? Authorizing Provider  ibuprofen (ADVIL,MOTRIN) 600 MG tablet Take 1 tablet (600 mg total) by mouth every 8 (eight) hours as needed. 03/03/17   Joni Reiningonald K Fanta Wimberley, PA-C  loperamide (IMODIUM A-D) 2 MG tablet Take 1 tablet (2 mg total) by mouth 4 (four) times daily as needed for diarrhea or loose stools. 02/15/17   Jene Everyobert Kinner, MD  ondansetron (ZOFRAN ODT) 4 MG disintegrating tablet Take 1 tablet (4 mg total) by mouth every 8 (eight) hours as needed for nausea or vomiting. 02/15/17   Jene Everyobert Kinner, MD  ondansetron (ZOFRAN) 4 MG tablet Take 1 tablet (4 mg total) by mouth every 8 (eight) hours as needed for nausea or vomiting. 09/15/16   Jeanmarie PlantJames A McShane, MD  traMADol (ULTRAM) 50 MG tablet Take 1 tablet (50 mg total) by mouth every 6 (six) hours as needed for moderate pain. 03/03/17   Joni Reiningonald K Jeannene Tschetter, PA-C    Allergies Patient has no known allergies.  Family History  Problem Relation Age of Onset  . Hearing loss Neg Hx     Social History Social History  Substance Use Topics  . Smoking status: Current Every Day Smoker    Years: 1.00    Types:  Cigarettes  . Smokeless tobacco: Never Used  . Alcohol use No    Review of Systems Constitutional: No fever/chills Eyes: No visual changes. ENT: No sore throat. Cardiovascular: Denies chest pain. Respiratory: Denies shortness of breath. Gastrointestinal: No abdominal pain.  No nausea, no vomiting.  No diarrhea.  No constipation. Genitourinary: Negative for dysuria. Musculoskeletal:Left hand pain Skin: Negative for rash. Neurological: Negative for headaches, focal weakness or numbness.    ____________________________________________   PHYSICAL EXAM:  VITAL SIGNS: ED Triage Vitals  Enc Vitals Group     BP 03/03/17 1430 (!) 156/78     Pulse Rate 03/03/17 1430 100     Resp 03/03/17 1430 20     Temp 03/03/17 1430 99.3 F (37.4 C)     Temp Source 03/03/17 1430 Oral     SpO2 03/03/17 1430 99 %     Weight 03/03/17 1431 114 lb (51.7 kg)     Height 03/03/17 1431 5' (1.524 m)     Head Circumference --      Peak Flow --      Pain Score 03/03/17 1431 9     Pain Loc --      Pain Edu? --      Excl. in GC? --    Constitutional: Alert and oriented. Well appearing and in no acute distress. Eyes: Conjunctivae are normal. PERRL. EOMI. Head: Atraumatic. Nose: No congestion/rhinnorhea. Mouth/Throat: Mucous membranes are  moist.  Oropharynx non-erythematous. Neck: No stridor.  No cervical spine tenderness to palpation. Hematological/Lymphatic/Immunilogical: No cervical lymphadenopathy. Cardiovascular: Normal rate, regular rhythm. Grossly normal heart sounds.  Good peripheral circulation. Elevated blood pressure Respiratory: Normal respiratory effort.  No retractions. Lungs CTAB. Gastrointestinal: Soft and nontender. No distention. No abdominal bruits. No CVA tenderness. Musculoskeletal: No obvious deformity. Mild edema to the third through fifth metatacapal. Neurologic:  Normal speech and language. No gross focal neurologic deficits are appreciated. No gait instability. Skin:  Skin is  warm, dry and intact. No rash noted. Psychiatric: Mood and affect are normal. Speech and behavior are normal.  ____________________________________________   LABS (all labs ordered are listed, but only abnormal results are displayed)  Labs Reviewed - No data to display ____________________________________________  EKG   ____________________________________________  RADIOLOGY  No acute findings x-ray of the left hand. ____________________________________________   PROCEDURES  Procedure(s) performed: None  Procedures  Critical Care performed: No  ____________________________________________   INITIAL IMPRESSION / ASSESSMENT AND PLAN / ED COURSE  Pertinent labs & imaging results that were available during my care of the patient were reviewed by me and considered in my medical decision making (see chart for details).  Sprain left hand. Patient given discharge care instructions. Patient a prescription for tramadol and ibuprofen. Patient given a work note.      ____________________________________________   FINAL CLINICAL IMPRESSION(S) / ED DIAGNOSES  Final diagnoses:  Left hand pain      NEW MEDICATIONS STARTED DURING THIS VISIT:  New Prescriptions   IBUPROFEN (ADVIL,MOTRIN) 600 MG TABLET    Take 1 tablet (600 mg total) by mouth every 8 (eight) hours as needed.   TRAMADOL (ULTRAM) 50 MG TABLET    Take 1 tablet (50 mg total) by mouth every 6 (six) hours as needed for moderate pain.     Note:  This document was prepared using Dragon voice recognition software and may include unintentional dictation errors.    Joni Reining, PA-C 03/03/17 1530    Merrily Brittle, MD 03/04/17 [phone]

## 2017-03-03 NOTE — ED Notes (Signed)

## 2018-01-01 ENCOUNTER — Encounter: Payer: Self-pay | Admitting: Emergency Medicine

## 2018-01-01 ENCOUNTER — Emergency Department
Admission: EM | Admit: 2018-01-01 | Discharge: 2018-01-01 | Disposition: A | Discharge status: Home / Self Care | Payer: Self-pay | Attending: Emergency Medicine | Admitting: Emergency Medicine

## 2018-01-01 ENCOUNTER — Other Ambulatory Visit: Payer: Self-pay

## 2018-01-01 DIAGNOSIS — R109 Unspecified abdominal pain: Secondary | ICD-10-CM | POA: Insufficient documentation

## 2018-01-01 DIAGNOSIS — Z5321 Procedure and treatment not carried out due to patient leaving prior to being seen by health care provider: Secondary | ICD-10-CM | POA: Insufficient documentation

## 2018-01-01 DIAGNOSIS — R1110 Vomiting, unspecified: Secondary | ICD-10-CM | POA: Insufficient documentation

## 2018-01-01 LAB — COMPREHENSIVE METABOLIC PANEL
ALBUMIN: 4.5 g/dL (ref 3.5–5.0)
ALT: 13 U/L — ABNORMAL LOW (ref 14–54)
ANION GAP: 9 (ref 5–15)
AST: 22 U/L (ref 15–41)
Alkaline Phosphatase: 66 U/L (ref 38–126)
BUN: 10 mg/dL (ref 6–20)
CHLORIDE: 105 mmol/L (ref 101–111)
CO2: 24 mmol/L (ref 22–32)
Calcium: 9.4 mg/dL (ref 8.9–10.3)
Creatinine, Ser: 0.76 mg/dL (ref 0.44–1.00)
GFR calc Af Amer: >60 mL/min (ref >60)
GFR calc non Af Amer: >60 mL/min (ref >60)
GLUCOSE: 83 mg/dL (ref 65–99)
POTASSIUM: 3.7 mmol/L (ref 3.5–5.1)
SODIUM: 138 mmol/L (ref 135–145)
Total Bilirubin: 0.4 mg/dL (ref 0.3–1.2)
Total Protein: 7.9 g/dL (ref 6.5–8.1)

## 2018-01-01 LAB — CBC
HEMATOCRIT: 41.8 % (ref 35.0–47.0)
HEMOGLOBIN: 13.9 g/dL (ref 12.0–16.0)
MCH: 29 pg (ref 26.0–34.0)
MCHC: 33.3 g/dL (ref 32.0–36.0)
MCV: 87.1 fL (ref 80.0–100.0)
Platelets: 334 10*3/uL (ref 150–440)
RBC: 4.8 MIL/uL (ref 3.80–5.20)
RDW: 14.4 % (ref 11.5–14.5)
WBC: 9 10*3/uL (ref 3.6–11.0)

## 2018-01-01 LAB — URINALYSIS, COMPLETE (UACMP) WITH MICROSCOPIC
BILIRUBIN URINE: NEGATIVE
Glucose, UA: NEGATIVE mg/dL
Ketones, ur: 20 mg/dL — AB
LEUKOCYTES UA: NEGATIVE
NITRITE: NEGATIVE
PROTEIN: 30 mg/dL — AB
SPECIFIC GRAVITY, URINE: 1.03 (ref 1.005–1.030)
pH: 5 (ref 5.0–8.0)

## 2018-01-01 LAB — LIPASE, BLOOD: LIPASE: 25 U/L (ref 11–51)

## 2018-01-01 LAB — POCT PREGNANCY, URINE: PREG TEST UR: NEGATIVE

## 2018-01-01 NOTE — ED Triage Notes (Signed)
Pt to ED via POV stating that she has been having abdominal cramping and emesis since this morning. Pt states that she has been feeling hot all day. Pt is able to keep fluid down now but was not able to this morning. Pt ambulatory to triage and is in NAD at this time.

## 2018-01-02 ENCOUNTER — Other Ambulatory Visit: Payer: Self-pay

## 2018-01-02 ENCOUNTER — Encounter: Payer: Self-pay | Admitting: Emergency Medicine

## 2018-01-02 ENCOUNTER — Emergency Department
Admission: EM | Admit: 2018-01-02 | Discharge: 2018-01-02 | Disposition: A | Discharge status: Home / Self Care | Payer: Self-pay | Attending: Emergency Medicine | Admitting: Emergency Medicine

## 2018-01-02 DIAGNOSIS — Z79899 Other long term (current) drug therapy: Secondary | ICD-10-CM | POA: Insufficient documentation

## 2018-01-02 DIAGNOSIS — F17210 Nicotine dependence, cigarettes, uncomplicated: Secondary | ICD-10-CM | POA: Insufficient documentation

## 2018-01-02 DIAGNOSIS — N3000 Acute cystitis without hematuria: Secondary | ICD-10-CM | POA: Insufficient documentation

## 2018-01-02 LAB — URINALYSIS, COMPLETE (UACMP) WITH MICROSCOPIC
Bilirubin Urine: NEGATIVE
Glucose, UA: NEGATIVE mg/dL
Ketones, ur: NEGATIVE mg/dL
Leukocytes, UA: NEGATIVE
Nitrite: NEGATIVE
PROTEIN: NEGATIVE mg/dL
Specific Gravity, Urine: 1.031 — ABNORMAL HIGH (ref 1.005–1.030)
pH: 5 (ref 5.0–8.0)

## 2018-01-02 LAB — POCT PREGNANCY, URINE: Preg Test, Ur: NEGATIVE

## 2018-01-02 MED ORDER — PHENAZOPYRIDINE HCL 200 MG PO TABS: 200.0000 mg | ORAL_TABLET | Freq: Three times a day (TID) | ORAL | 0 refills | Status: DC | PRN

## 2018-01-02 MED ORDER — SULFAMETHOXAZOLE-TRIMETHOPRIM 800-160 MG PO TABS: 1.0000 | ORAL_TABLET | Freq: Two times a day (BID) | ORAL | 0 refills | Status: DC

## 2018-01-02 NOTE — ED Triage Notes (Signed)
Pt reports pain when urinating.  

## 2018-01-02 NOTE — ED Notes (Signed)
Pt reports nausea and crotch being sore. Pt also reports subjective fevers at home for the past few days.

## 2018-01-02 NOTE — ED Notes (Signed)
Pt discharge instructions, RX, follow up reviewed. All questions and concerns answered. NAD. VSS. Ambulatory without difficulty.

## 2018-01-02 NOTE — ED Provider Notes (Signed)
[facility] Emergency Department Provider Note   ____________________________________________   First MD Initiated Contact with Patient 01/02/18 1148     (approximate)  I have reviewed the triage vital signs and the nursing notes.   HISTORY  Chief Complaint Urinary Tract Infection    HPI Susan Conrad is a 22 y.o. female patient complain for 2 days.  Patient denies vaginal discharge, flank pain, or fever.  Patient rates her pain discomfort as a 6/10.  Patient describes her pain is "bladder pressure".  No palliative measures for complaint.  History reviewed. No pertinent past medical history.  Patient Active Problem List   Diagnosis Date Noted  . MENORRHAGIA 09/04/2010    Past Surgical History:  Procedure Laterality Date  . NO PAST SURGERIES      Prior to Admission medications   Medication Sig Start Date End Date Taking? Authorizing Provider  ibuprofen (ADVIL,MOTRIN) 600 MG tablet Take 1 tablet (600 mg total) by mouth every 8 (eight) hours as needed. 03/03/17   Joni ReiningSmith, Willey Due K, PA-C  loperamide (IMODIUM A-D) 2 MG tablet Take 1 tablet (2 mg total) by mouth 4 (four) times daily as needed for diarrhea or loose stools. 02/15/17   Jene EveryKinner, Robert, MD  ondansetron (ZOFRAN ODT) 4 MG disintegrating tablet Take 1 tablet (4 mg total) by mouth every 8 (eight) hours as needed for nausea or vomiting. 02/15/17   Jene EveryKinner, Robert, MD  ondansetron (ZOFRAN) 4 MG tablet Take 1 tablet (4 mg total) by mouth every 8 (eight) hours as needed for nausea or vomiting. 09/15/16   Jeanmarie PlantMcShane, James A, MD  phenazopyridine (PYRIDIUM) 200 MG tablet Take 1 tablet (200 mg total) by mouth 3 (three) times daily as needed for pain. 01/02/18   Joni ReiningSmith, Melat Wrisley K, PA-C  sulfamethoxazole-trimethoprim (BACTRIM DS,SEPTRA DS) 800-160 MG tablet Take 1 tablet by mouth 2 (two) times daily. 01/02/18   Joni ReiningSmith, Chanler Mendonca K, PA-C  traMADol (ULTRAM) 50 MG tablet Take 1 tablet (50 mg total) by mouth every 6 (six)  hours as needed for moderate pain. 03/03/17   Joni ReiningSmith, Ulyssa Walthour K, PA-C    Allergies Patient has no known allergies.  Family History  Problem Relation Age of Onset  . Hearing loss Neg Hx     Social History Social History   Tobacco Use  . Smoking status: Current Every Day Smoker    Years: 1.00    Types: Cigarettes  . Smokeless tobacco: Never Used  Substance Use Topics  . Alcohol use: No  . Drug use: No    Review of Systems Constitutional: No fever/chills Eyes: No visual changes. ENT: No sore throat. Cardiovascular: Denies chest pain. Respiratory: Denies shortness of breath. Gastrointestinal: No abdominal pain.  No nausea, no vomiting.  No diarrhea.  No constipation. Genitourinary: Positive for dysuria. Musculoskeletal: Negative for back pain. Skin: Negative for rash. Neurological: Negative for headaches, focal weakness or numbness.   ____________________________________________   PHYSICAL EXAM:  VITAL SIGNS: ED Triage Vitals  Enc Vitals Group     BP 01/02/18 1058 105/61     Pulse Rate 01/02/18 1058 75     Resp 01/02/18 1058 20     Temp 01/02/18 1058 98.9 F (37.2 C)     Temp Source 01/02/18 1058 Oral     SpO2 01/02/18 1058 99 %     Weight 01/02/18 1059 114 lb (51.7 kg)     Height --      Head Circumference --      Peak Flow --  Pain Score 01/02/18 1058 8     Pain Loc --      Pain Edu? --      Excl. in GC? --    Constitutional: Alert and oriented. Well appearing and in no acute distress. Cardiovascular: Normal rate, regular rhythm. Grossly normal heart sounds.  Good peripheral circulation. Respiratory: Normal respiratory effort.  No retractions. Lungs CTAB. Gastrointestinal: Soft and nontender. No distention. No abdominal bruits. No CVA tenderness. Genitourinary:.  Deferred. Skin:  Skin is warm, dry and intact. No rash noted. Psychiatric: Mood and affect are normal. Speech and behavior are normal.  ____________________________________________    LABS (all labs ordered are listed, but only abnormal results are displayed)  Labs Reviewed  URINALYSIS, COMPLETE (UACMP) WITH MICROSCOPIC - Abnormal; Notable for the following components:      Result Value   Color, Urine YELLOW (*)    APPearance HAZY (*)    Specific Gravity, Urine 1.031 (*)    Hgb urine dipstick LARGE (*)    Bacteria, UA FEW (*)    Squamous Epithelial / LPF 6-30 (*)    All other components within normal limits  POC URINE PREG, ED  POCT PREGNANCY, URINE   ____________________________________________  EKG   ____________________________________________  RADIOLOGY  No results found.  ____________________________________________   PROCEDURES  Procedure(s) performed: None  Procedures  Critical Care performed: No  ____________________________________________   INITIAL IMPRESSION / ASSESSMENT AND PLAN / ED COURSE  As part of my medical decision making, I reviewed the following data within the electronic MEDICAL RECORD NUMBER    Dysuria secondary to urinary tract infection.  Discussed lab results with patient.  Patient given discharge care instructions.  Patient advised take medication as directed.  Patient advised [facility].      ____________________________________________   FINAL CLINICAL IMPRESSION(S) / ED DIAGNOSES  Final diagnoses:  Acute cystitis without hematuria     ED Discharge Orders        Ordered    sulfamethoxazole-trimethoprim (BACTRIM DS,SEPTRA DS) 800-160 MG tablet  2 times daily     01/02/18 1219    phenazopyridine (PYRIDIUM) 200 MG tablet  3 times daily PRN     01/02/18 1219       Note:  This document was prepared using Dragon voice recognition software and may include unintentional dictation errors.    Joni Reining, PA-C 01/02/18 1255    Minna Antis, MD 01/02/18 1547

## 2018-02-03 ENCOUNTER — Encounter: Payer: Self-pay | Admitting: *Deleted

## 2018-02-03 ENCOUNTER — Other Ambulatory Visit: Payer: Self-pay

## 2018-02-03 ENCOUNTER — Emergency Department
Admission: EM | Admit: 2018-02-03 | Discharge: 2018-02-03 | Disposition: A | Discharge status: Home / Self Care | Payer: Self-pay | Attending: Emergency Medicine | Admitting: Emergency Medicine

## 2018-02-03 DIAGNOSIS — F17210 Nicotine dependence, cigarettes, uncomplicated: Secondary | ICD-10-CM | POA: Insufficient documentation

## 2018-02-03 DIAGNOSIS — R69 Illness, unspecified: Secondary | ICD-10-CM

## 2018-02-03 DIAGNOSIS — J111 Influenza due to unidentified influenza virus with other respiratory manifestations: Secondary | ICD-10-CM | POA: Insufficient documentation

## 2018-02-03 DIAGNOSIS — Z79899 Other long term (current) drug therapy: Secondary | ICD-10-CM | POA: Insufficient documentation

## 2018-02-03 MED ORDER — FLUTICASONE PROPIONATE 50 MCG/ACT NA SUSP: 2.0000 | Freq: Every day | NASAL | 0 refills | Status: DC

## 2018-02-03 MED ORDER — BENZONATATE 100 MG PO CAPS: 100.0000 mg | ORAL_CAPSULE | Freq: Three times a day (TID) | ORAL | 0 refills | Status: DC | PRN

## 2018-02-03 MED ORDER — IBUPROFEN 800 MG PO TABS: 800.0000 mg | ORAL_TABLET | Freq: Three times a day (TID) | ORAL | 0 refills | Status: DC | PRN

## 2018-02-03 MED ORDER — OSELTAMIVIR PHOSPHATE 75 MG PO CAPS: 75.0000 mg | ORAL_CAPSULE | Freq: Two times a day (BID) | ORAL | 0 refills | Status: AC

## 2018-02-03 NOTE — ED Triage Notes (Signed)
Pt to ED reporting cough and congestion for the past three days. Pt had one episode of vomiting and reports family has had the flu. Pt has not checked temp at home but reports having felt warm.

## 2018-02-03 NOTE — ED Provider Notes (Signed)
[facility] Emergency Department Provider Note  ____________________________________________  Time seen: Approximately 2:30 PM  I have reviewed the triage vital signs and the nursing notes.   HISTORY  Chief Complaint Cough and Nasal Congestion    HPI Susan Conrad is a 22 y.o. female that presents to the emergency department for evaluation of headache, chills, body aches, nasal congestion, sore throat, productive cough with clear sputum for 2 days.  She has 3 nieces and nephews that were diagnosed with influenza this week and have spent time at her house.  She has taken Motrin and TheraFlu for symptoms.  She smokes 8-9 cigarettes/day.  Her sister is here for similar symptoms.  She denies nausea, vomiting, abdominal pain, diarrhea, constipation.   History reviewed. No pertinent past medical history.  Patient Active Problem List   Diagnosis Date Noted  . MENORRHAGIA 09/04/2010    Past Surgical History:  Procedure Laterality Date  . NO PAST SURGERIES      Prior to Admission medications   Medication Sig Start Date End Date Taking? Authorizing Provider  benzonatate (TESSALON PERLES) 100 MG capsule Take 1 capsule (100 mg total) by mouth 3 (three) times daily as needed for cough. 02/03/18 02/03/19  Enid Derry, PA-C  fluticasone (FLONASE) 50 MCG/ACT nasal spray Place 2 sprays into both nostrils daily. 02/03/18 02/03/19  Enid Derry, PA-C  ibuprofen (ADVIL,MOTRIN) 800 MG tablet Take 1 tablet (800 mg total) by mouth every 8 (eight) hours as needed. 02/03/18   Enid Derry, PA-C  loperamide (IMODIUM A-D) 2 MG tablet Take 1 tablet (2 mg total) by mouth 4 (four) times daily as needed for diarrhea or loose stools. 02/15/17   Jene Every, MD  ondansetron (ZOFRAN ODT) 4 MG disintegrating tablet Take 1 tablet (4 mg total) by mouth every 8 (eight) hours as needed for nausea or vomiting. 02/15/17   Jene Every, MD  ondansetron (ZOFRAN) 4 MG tablet Take 1 tablet (4  mg total) by mouth every 8 (eight) hours as needed for nausea or vomiting. 09/15/16   Jeanmarie Plant, MD  oseltamivir (TAMIFLU) 75 MG capsule Take 1 capsule (75 mg total) by mouth 2 (two) times daily for 5 days. 02/03/18 02/08/18  Enid Derry, PA-C  phenazopyridine (PYRIDIUM) 200 MG tablet Take 1 tablet (200 mg total) by mouth 3 (three) times daily as needed for pain. 01/02/18   Joni Reining, PA-C  sulfamethoxazole-trimethoprim (BACTRIM DS,SEPTRA DS) 800-160 MG tablet Take 1 tablet by mouth 2 (two) times daily. 01/02/18   Joni Reining, PA-C  traMADol (ULTRAM) 50 MG tablet Take 1 tablet (50 mg total) by mouth every 6 (six) hours as needed for moderate pain. 03/03/17   Joni Reining, PA-C    Allergies Patient has no known allergies.  Family History  Problem Relation Age of Onset  . Hearing loss Neg Hx     Social History Social History   Tobacco Use  . Smoking status: Current Every Day Smoker    Years: 1.00    Types: Cigarettes  . Smokeless tobacco: Never Used  Substance Use Topics  . Alcohol use: No  . Drug use: No     Review of Systems  Eyes: No visual changes. No discharge. ENT: Positive for congestion and rhinorrhea. Cardiovascular: No chest pain. Respiratory: Positive for cough. No SOB. Gastrointestinal: No abdominal pain.  No nausea, no vomiting.  No diarrhea.  No constipation. Musculoskeletal: Positive for body aches. Skin: Negative for rash, abrasions, lacerations, ecchymosis. Neurological: Positive for  headache.   ____________________________________________   PHYSICAL EXAM:  VITAL SIGNS: ED Triage Vitals [02/03/18 1248]  Enc Vitals Group     BP      Pulse      Resp      Temp      Temp src      SpO2      Weight 110 lb (49.9 kg)     Height 4\' 11"  (1.499 m)     Head Circumference      Peak Flow      Pain Score 9     Pain Loc      Pain Edu?      Excl. in GC?      Constitutional: Alert and oriented. Well appearing and in no acute  distress. Eyes: Conjunctivae are normal. PERRL. EOMI. No discharge. Head: Atraumatic. ENT: No frontal and maxillary sinus tenderness.      Ears: Tympanic membranes pearly gray with good landmarks. No discharge.      Nose: Mild congestion/rhinnorhea.      Mouth/Throat: Mucous membranes are moist. Oropharynx non-erythematous. Tonsils not enlarged. No exudates. Uvula midline. Neck: No stridor.   Hematological/Lymphatic/Immunilogical: No cervical lymphadenopathy. Cardiovascular: Normal rate, regular rhythm.  Good peripheral circulation. Respiratory: Normal respiratory effort without tachypnea or retractions. Lungs CTAB. Good air entry to the bases with no decreased or absent breath sounds. Gastrointestinal: Bowel sounds 4 quadrants. Soft and nontender to palpation. No guarding or rigidity. No palpable masses. No distention. Musculoskeletal: Full range of motion to all extremities. No gross deformities appreciated. Neurologic:  Normal speech and language. No gross focal neurologic deficits are appreciated.  Skin:  Skin is warm, dry and intact. No rash noted.    ____________________________________________   LABS (all labs ordered are listed, but only abnormal results are displayed)  Labs Reviewed - No data to display ____________________________________________  EKG   ____________________________________________  RADIOLOGY   No results found.  ____________________________________________    PROCEDURES  Procedure(s) performed:    Procedures    Medications - No data to display   ____________________________________________   INITIAL IMPRESSION / ASSESSMENT AND PLAN / ED COURSE  Pertinent labs & imaging results that were available during my care of the patient were reviewed by me and considered in my medical decision making (see chart for details).  Review of the [place] CSRS was performed in accordance of the [facility] prior to dispensing any controlled drugs.  Patient's  diagnosis is consistent with influenza like illness. Vital signs and exam are reassuring.  Patient has been around 3 family members with influenza this week and is agreeable to be treated based on symptoms.  Patient appears well and is staying well hydrated. Patient should alternate tylenol and ibuprofen for fever. Patient feels comfortable going home. Patient will be discharged home with prescriptions for Tamiflu, Flonase, Tessalon Perles, ibuprofen. Patient is to follow up with  PCP as needed or otherwise directed. Patient is given ED precautions to return to the ED for any worsening or new symptoms.     ____________________________________________  FINAL CLINICAL IMPRESSION(S) / ED DIAGNOSES  Final diagnoses:  Influenza-like illness      NEW MEDICATIONS STARTED DURING THIS VISIT:  ED Discharge Orders        Ordered    oseltamivir (TAMIFLU) 75 MG capsule  2 times daily     02/03/18 1452    fluticasone (FLONASE) 50 MCG/ACT nasal spray  Daily     02/03/18 1452    benzonatate (TESSALON PERLES) 100 MG capsule  3 times daily PRN     02/03/18 1452    ibuprofen (ADVIL,MOTRIN) 800 MG tablet  Every 8 hours PRN     02/03/18 1452          This chart was dictated using voice recognition software/Dragon. Despite best efforts to proofread, errors can occur which can change the meaning. Any change was purely unintentional.    Enid Derry, PA-C 02/03/18 1516    Jeanmarie Plant, MD 02/03/18 1539

## 2018-02-03 NOTE — ED Notes (Signed)
Pt c/o cough, congestion, and body temperature feeling warm the past three days. Pt reports family has had flu recently

## 2018-09-23 ENCOUNTER — Encounter: Payer: Self-pay | Admitting: Emergency Medicine

## 2018-09-23 ENCOUNTER — Other Ambulatory Visit: Payer: Self-pay

## 2018-09-23 ENCOUNTER — Emergency Department: Payer: Self-pay

## 2018-09-23 ENCOUNTER — Emergency Department
Admission: EM | Admit: 2018-09-23 | Discharge: 2018-09-23 | Disposition: A | Discharge status: Home / Self Care | Payer: Self-pay | Attending: Emergency Medicine | Admitting: Emergency Medicine

## 2018-09-23 DIAGNOSIS — R1012 Left upper quadrant pain: Secondary | ICD-10-CM | POA: Insufficient documentation

## 2018-09-23 DIAGNOSIS — F17210 Nicotine dependence, cigarettes, uncomplicated: Secondary | ICD-10-CM | POA: Insufficient documentation

## 2018-09-23 DIAGNOSIS — R079 Chest pain, unspecified: Secondary | ICD-10-CM

## 2018-09-23 DIAGNOSIS — R0789 Other chest pain: Secondary | ICD-10-CM | POA: Insufficient documentation

## 2018-09-23 LAB — URINALYSIS, COMPLETE (UACMP) WITH MICROSCOPIC
BACTERIA UA: NONE SEEN
BILIRUBIN URINE: NEGATIVE
Glucose, UA: NEGATIVE mg/dL
Hgb urine dipstick: NEGATIVE
KETONES UR: 5 mg/dL — AB
Nitrite: NEGATIVE
Protein, ur: NEGATIVE mg/dL
SPECIFIC GRAVITY, URINE: 1.027 (ref 1.005–1.030)
pH: 6 (ref 5.0–8.0)

## 2018-09-23 LAB — CBC
HCT: 39.6 % (ref 35.0–47.0)
Hemoglobin: 13.6 g/dL (ref 12.0–16.0)
MCH: 31.1 pg (ref 26.0–34.0)
MCHC: 34.2 g/dL (ref 32.0–36.0)
MCV: 90.8 fL (ref 80.0–100.0)
PLATELETS: 334 10*3/uL (ref 150–440)
RBC: 4.37 MIL/uL (ref 3.80–5.20)
RDW: 14.5 % (ref 11.5–14.5)
WBC: 10.5 10*3/uL (ref 3.6–11.0)

## 2018-09-23 LAB — FIBRIN DERIVATIVES D-DIMER (ARMC ONLY): Fibrin derivatives D-dimer (ARMC): 152.26 ng/mL (FEU) (ref 0.00–499.00)

## 2018-09-23 LAB — POCT PREGNANCY, URINE: PREG TEST UR: NEGATIVE

## 2018-09-23 LAB — BASIC METABOLIC PANEL
Anion gap: 9 (ref 5–15)
BUN: <5 mg/dL — ABNORMAL LOW (ref 6–20)
CALCIUM: 9.6 mg/dL (ref 8.9–10.3)
CO2: 29 mmol/L (ref 22–32)
CREATININE: 0.74 mg/dL (ref 0.44–1.00)
Chloride: 103 mmol/L (ref 98–111)
GFR calc non Af Amer: >60 mL/min (ref >60)
Glucose, Bld: 105 mg/dL — ABNORMAL HIGH (ref 70–99)
POTASSIUM: 3.4 mmol/L — AB (ref 3.5–5.1)
SODIUM: 141 mmol/L (ref 135–145)

## 2018-09-23 LAB — TROPONIN I

## 2018-09-23 MED ORDER — ONDANSETRON HCL 4 MG/2ML IJ SOLN
4.0000 mg | Freq: Once | INTRAMUSCULAR | Status: AC
  Administered 2018-09-23: 4 mg via INTRAVENOUS

## 2018-09-23 MED ORDER — ACETAMINOPHEN 325 MG PO TABS
650.0000 mg | ORAL_TABLET | Freq: Once | ORAL | Status: AC
  Administered 2018-09-23: 650 mg via ORAL
  Filled 2018-09-23: qty 2

## 2018-09-23 MED ORDER — ONDANSETRON HCL 4 MG/2ML IJ SOLN
INTRAMUSCULAR | Status: AC
  Filled 2018-09-23: qty 2

## 2018-09-23 NOTE — ED Triage Notes (Signed)
Pt presents to ED via POV with c/o "chest problems" on and off. Pt c/o sharp pain in L side of her chest. Pt denies radiations, classifies pain as sharp. Pt states family hx of heart problems.

## 2018-09-23 NOTE — Discharge Instructions (Addendum)
You may take Tylenol or Motrin for your pain.  Please make a follow-up appoint with your primary care physician for reevaluation.  Return to the emergency department if you develop severe pain, lightheadedness or fainting, fever, inability to keep down fluids, shortness of breath or palpitations, or for any other symptoms concerning to you.

## 2018-09-23 NOTE — ED Provider Notes (Signed)
[facility] Emergency Department Provider Note  ____________________________________________  Time seen: Approximately 5:37 PM  I have reviewed the triage vital signs and the nursing notes.   HISTORY  Chief Complaint Chest Pain    HPI Susan Conrad is a 22 y.o. female, ongoing tobacco abuse, presenting with chest pain.  The patient reports that for the past several weeks, she has had 1-2 weekly episodes of a sharp left-sided chest pain that lasts for 1 second and then goes away.  Today, the patient states she is having the same pain but it has not gone away.  She also began to have a left upper quadrant/flank pain without any dysuria, hematuria or urinary frequency per she has not tried anything for her pain.  She denies any palpitations, lightheadedness or syncope, shortness of breath, diaphoresis, nausea or vomiting.  She has not had any recent illness including cough or cold symptoms, fever or chills.  She has no lower extremely swelling or calf pain.  She has no personal or family history of blood clots.  SH: Positive tobacco abuse; denies cocaine.  FH: Father and brother have "heart problems," but she is unable to say what those heart problems are whether they are congenital.  She denies any family history of sudden cardiac death.   History reviewed. No pertinent past medical history.  Patient Active Problem List   Diagnosis Date Noted  . MENORRHAGIA 09/04/2010    Past Surgical History:  Procedure Laterality Date  . NO PAST SURGERIES      Current Outpatient Rx  . Order #: [id number] Class: Print  . Order #: [id number] Class: Print  . Order #: [id number] Class: Print  . Order #: [id number] Class: Print  . Order #: [id number] Class: Print  . Order #: [id number] Class: Print  . Order #: [id number] Class: Print  . Order #: [id number] Class: Print  . Order #: [id number] Class: Print    Allergies Patient has no known allergies.  Family History  Problem  Relation Age of Onset  . Hearing loss Neg Hx     Social History Social History   Tobacco Use  . Smoking status: Current Every Day Smoker    Years: 1.00    Types: Cigarettes  . Smokeless tobacco: Never Used  Substance Use Topics  . Alcohol use: No  . Drug use: No    Review of Systems Constitutional: No fever/chills.  No lightheadedness or syncope.  No diaphoresis. Eyes: No visual changes. ENT: No sore throat. No congestion or rhinorrhea. Cardiovascular: Positive left-sided chest pain. Denies palpitations. Respiratory: Denies shortness of breath.  No cough. Gastrointestinal: Positive left upper quadrant abdominal pain and left flank pain.  No nausea, no vomiting.  No diarrhea.  No constipation. Genitourinary: Negative for dysuria.  No urinary frequency.  No hematuria. Musculoskeletal: Negative for back pain.  No lower extremity swelling or calf pain. Skin: Negative for rash. Neurological: Negative for headaches. No focal numbness, tingling or weakness.     ____________________________________________   PHYSICAL EXAM:  VITAL SIGNS: ED Triage Vitals  Enc Vitals Group     BP 09/23/18 1447 134/85     Pulse Rate 09/23/18 1447 81     Resp 09/23/18 1619 18     Temp 09/23/18 1447 98.6 F (37 C)     Temp Source 09/23/18 1447 Oral     SpO2 09/23/18 1447 99 %     Weight 09/23/18 1449 115 lb (52.2 kg)     Height 09/23/18 1449 5' (1.524 m)     Head  Circumference --      Peak Flow --      Pain Score 09/23/18 1449 10     Pain Loc --      Pain Edu? --      Excl. in GC? --     Constitutional: Alert and oriented.  Answers questions appropriately. Eyes: Conjunctivae are normal.  EOMI. No scleral icterus. Head: Atraumatic. Nose: No congestion/rhinnorhea. Mouth/Throat: Mucous membranes are moist.  Neck: No stridor.  Supple.   Cardiovascular: Normal rate, regular rhythm. No murmurs, rubs or gallops.  Respiratory: Normal respiratory effort.  No accessory muscle use or  retractions. Lungs CTAB.  No wheezes, rales or ronchi. Gastrointestinal: Soft, and nondistended.  Minimal discomfort with palpation in the left upper quadrant.  No right upper quadrant pain; negative Murphy sign.  No reproducible CVA tenderness to palpation.  No guarding or rebound.  No peritoneal signs. Musculoskeletal: No LE edema. No ttp in the calves or palpable cords.  Negative Homan's sign. Neurologic:  A&Ox3.  Speech is clear.  Face and smile are symmetric.  EOMI.  Moves all extremities well. Skin:  Skin is warm, dry and intact. No rash noted. Psychiatric: Mood and affect are normal. Speech and behavior are normal.  Normal judgement.  ____________________________________________   LABS (all labs ordered are listed, but only abnormal results are displayed)  Labs Reviewed  BASIC METABOLIC PANEL - Abnormal; Notable for the following components:      Result Value   Potassium 3.4 (*)    Glucose, Bld 105 (*)    BUN <5 (*)    All other components within normal limits  CBC  TROPONIN I  FIBRIN DERIVATIVES D-DIMER ([facility] ONLY)  URINALYSIS, COMPLETE (UACMP) WITH MICROSCOPIC  POCT PREGNANCY, URINE  POC URINE PREG, ED   ____________________________________________  EKG  ED ECG REPORT I, Anne-Caroline Sharma Covert, the attending physician, personally viewed and interpreted this ECG.   Date: 09/23/2018  EKG Time: 1442  Rate: 74  Rhythm: normal sinus rhythm with sinus arrhythmia  Axis: normal  Intervals:none  ST&T Change: No STEMI; no evidence of Brugada syndrome, prolonged QTC or hypertrophy.  ____________________________________________  RADIOLOGY  Dg Chest 2 View  Result Date: 09/23/2018 CLINICAL DATA:  Intermittent sharp left-sided chest discomfort. Current smoker. EXAM: CHEST - 2 VIEW COMPARISON:  Report of a chest x-ray dated December 06, 2002. FINDINGS: The lungs are well-expanded. There is no focal infiltrate. There is no pleural effusion, pneumothorax, or pneumomediastinum.  The heart and pulmonary vascularity are normal. The mediastinum is normal in width. The bony thorax exhibits no acute abnormality. IMPRESSION: There is no active cardiopulmonary disease. Electronically Signed   By: David  [place] M.D.   On: 09/23/2018 15:15    ____________________________________________   PROCEDURES  Procedure(s) performed: None  Procedures  Critical Care performed: No ____________________________________________   INITIAL IMPRESSION / ASSESSMENT AND PLAN / ED COURSE  Pertinent labs & imaging results that were available during my care of the patient were reviewed by me and considered in my medical decision making (see chart for details).  22 y.o. female with tobacco abuse presenting with intermittent left-sided chest pain, now worse, now with left upper quadrant pain.  Overall, the patient is hematin apically stable and afebrile.  Her abdominal examination is reassuring and she has only minimal tenderness in the left upper quadrant.  Her laboratory studies are also reassuring and a urinalysis is pending.  An acute surgical pathology such as gallbladder infection or appendicitis, diverticulitis with or without complication, is very  unlikely today.  No acute imaging is indicated at this time.  From a chest pain standpoint, the patient has no evidence of ACS or MI; her EKG does not show ischemic changes and her troponin is negative.  She has no evidence of DVT, no tachycardia, no hypoxia and no history of blood clots; a d-dimer will be sent and followed with CT if positive.  The patient will be treated with Tylenol for her pain.  Plan reevaluation for final disposition.  ----------------------------------------- 7:03 PM on 09/23/2018 -----------------------------------------  The patient's d-dimer is negative.  Her urinalysis is pending; when it returns, we will plan to discharge the patient home.  Follow-up instructions as well as return precautions were  discussed.  ____________________________________________  FINAL CLINICAL IMPRESSION(S) / ED DIAGNOSES  Final diagnoses:  Left-sided chest pain  Left upper quadrant pain         NEW MEDICATIONS STARTED DURING THIS VISIT:  New Prescriptions   No medications on file      Rockne Menghini, MD 09/23/18 1903

## 2018-12-17 ENCOUNTER — Emergency Department
Admission: EM | Admit: 2018-12-17 | Discharge: 2018-12-17 | Disposition: A | Discharge status: Home / Self Care | Payer: Medicaid Other | Attending: Emergency Medicine | Admitting: Emergency Medicine

## 2018-12-17 ENCOUNTER — Other Ambulatory Visit: Payer: Self-pay

## 2018-12-17 ENCOUNTER — Encounter: Payer: Self-pay | Admitting: Emergency Medicine

## 2018-12-17 DIAGNOSIS — J020 Streptococcal pharyngitis: Secondary | ICD-10-CM

## 2018-12-17 DIAGNOSIS — F17210 Nicotine dependence, cigarettes, uncomplicated: Secondary | ICD-10-CM | POA: Insufficient documentation

## 2018-12-17 LAB — INFLUENZA PANEL BY PCR (TYPE A & B)
INFLBPCR: NEGATIVE
Influenza A By PCR: NEGATIVE

## 2018-12-17 LAB — GROUP A STREP BY PCR: Group A Strep by PCR: DETECTED — AB

## 2018-12-17 MED ORDER — AMOXICILLIN 875 MG PO TABS: 875.0000 mg | ORAL_TABLET | Freq: Two times a day (BID) | ORAL | 0 refills | Status: DC

## 2018-12-17 NOTE — ED Triage Notes (Signed)
Pt c/o sore throat x 2-3 days, pt states sxs worse this morning. Pt also reports right ear pain as well.  Pt has productive cough as well.

## 2018-12-17 NOTE — ED Notes (Signed)
See triage note  Presents with sore throat and right ear pain  Sx's started couple of days ago  Subjective fever at home low grade fever on arrival  Throat red and slightly swollen

## 2018-12-17 NOTE — ED Provider Notes (Signed)
[facility] Emergency Department Provider Note  ____________________________________________   First MD Initiated Contact with Patient 12/17/18 1240     (approximate)  I have reviewed the triage vital signs and the nursing notes.   HISTORY  Chief Complaint Sore Throat and Cough   HPI Susan Conrad is a 22 y.o. female presents to the ED with complaint of sore throat.  Patient states that symptoms began 2 to 3 days ago but was worse this morning.  She also complains of right ear pain.  She is unaware of any strep throat exposure.  Patient continues to drink fluids.  She rates her pain as an 8 out of 10.   History reviewed. No pertinent past medical history.  Patient Active Problem List   Diagnosis Date Noted  . MENORRHAGIA 09/04/2010    Past Surgical History:  Procedure Laterality Date  . NO PAST SURGERIES      Prior to Admission medications   Medication Sig Start Date End Date Taking? Authorizing Provider  amoxicillin (AMOXIL) 875 MG tablet Take 1 tablet (875 mg total) by mouth 2 (two) times daily. 12/17/18   Tommi RumpsSummers, Alaysha Jefcoat L, PA-C  fluticasone (FLONASE) 50 MCG/ACT nasal spray Place 2 sprays into both nostrils daily. 02/03/18 02/03/19  Enid DerryWagner, Ashley, PA-C  loperamide (IMODIUM A-D) 2 MG tablet Take 1 tablet (2 mg total) by mouth 4 (four) times daily as needed for diarrhea or loose stools. 02/15/17   Jene EveryKinner, Robert, MD    Allergies Patient has no known allergies.  Family History  Problem Relation Age of Onset  . Hearing loss Neg Hx     Social History Social History   Tobacco Use  . Smoking status: Current Every Day Smoker    Packs/day: 0.25    Years: 1.00    Pack years: 0.25    Types: Cigarettes  . Smokeless tobacco: Never Used  Substance Use Topics  . Alcohol use: No  . Drug use: No    Review of Systems Constitutional: Positive fever/chills Eyes: No visual changes. ENT: Positive sore throat. Cardiovascular: Denies chest  pain. Respiratory: Denies shortness of breath. Gastrointestinal: No abdominal pain.  No nausea, no vomiting.  Musculoskeletal: Negative for back pain. Skin: Negative for rash. Neurological: Negative for headaches, focal weakness or numbness. ____________________________________________   PHYSICAL EXAM:  VITAL SIGNS: ED Triage Vitals  Enc Vitals Group     BP 12/17/18 1200 117/77     Pulse Rate 12/17/18 1200 (!) 104     Resp 12/17/18 1200 16     Temp 12/17/18 1200 99.5 F (37.5 C)     Temp Source 12/17/18 1200 Oral     SpO2 12/17/18 1200 100 %     Weight 12/17/18 1200 115 lb (52.2 kg)     Height 12/17/18 1200 5' (1.524 m)     Head Circumference --      Peak Flow --      Pain Score 12/17/18 1212 8     Pain Loc --      Pain Edu? --      Excl. in GC? --    Constitutional: Alert and oriented. Well appearing and in no acute distress. Eyes: Conjunctivae are normal. PERRL. EOMI. Head: Atraumatic. Nose: Mild congestion/rhinnorhea. Mouth/Throat: Mucous membranes are moist.  Oropharynx erythematous without exudate.  Uvula is midline. Neck: No stridor.   Hematological/Lymphatic/Immunilogical: Minimal bilateral cervical lymphadenopathy. Cardiovascular: Normal rate, regular rhythm. Grossly normal heart sounds.  Good peripheral circulation. Respiratory: Normal respiratory effort.  No  retractions. Lungs CTAB. Gastrointestinal: Soft and nontender. No distention.  Musculoskeletal: Moves upper and lower extremities without any difficulty.  Normal gait was noted. Neurologic:  Normal speech and language. No gross focal neurologic deficits are appreciated. No gait instability. Skin:  Skin is warm, dry and intact. No rash noted. Psychiatric: Mood and affect are normal. Speech and behavior are normal.  ____________________________________________   LABS (all labs ordered are listed, but only abnormal results are displayed)  Labs Reviewed  GROUP A STREP BY PCR - Abnormal; Notable for the  following components:      Result Value   Group A Strep by PCR DETECTED (*)    All other components within normal limits  INFLUENZA PANEL BY PCR (TYPE A & B)    PROCEDURES  Procedure(s) performed: None  Procedures  Critical Care performed: No  ____________________________________________   INITIAL IMPRESSION / ASSESSMENT AND PLAN / ED COURSE  As part of my medical decision making, I reviewed the following data within the electronic MEDICAL RECORD NUMBER Notes from prior ED visits and [place] Controlled Substance Database  Presents to the ED with complaint of sore throat for the last 2 to 3 days which was worse this morning.  She has had a productive cough and right ear pain with her symptoms.  Patient presented to triage with a temperature of 99.5.  Physical exam is suspicious for strep pharyngitis as it is erythematous without exudate.  Strep test was positive.  Patient was made aware that she will need to take the entire 10-day course of antibiotics until completely finished.  She is encouraged to take Tylenol or ibuprofen as needed for throat pain and to increase fluids.  She is to follow-up with her PCP if any continued problems.  ____________________________________________   FINAL CLINICAL IMPRESSION(S) / ED DIAGNOSES  Final diagnoses:  Strep pharyngitis     ED Discharge Orders         Ordered    amoxicillin (AMOXIL) 875 MG tablet  2 times daily     12/17/18 1404           Note:  This document was prepared using Dragon voice recognition software and may include unintentional dictation errors.    Tommi RumpsSummers, Izza Bickle L, PA-C 12/17/18 1409    Emily FilbertWilliams, Jonathan E, MD 12/17/18 [phone]

## 2018-12-17 NOTE — Discharge Instructions (Signed)
Follow-up with your primary care provider or can no clinic acute care if any continued problems.  You must take the entire 10-day course of antibiotics until completely finished.  You are contagious for 24 hours after starting the antibiotic.  Take Tylenol or ibuprofen as needed for throat pain or fever.  Increase fluids.

## 2019-02-17 ENCOUNTER — Emergency Department
Admission: EM | Admit: 2019-02-17 | Discharge: 2019-02-17 | Disposition: A | Discharge status: Home / Self Care | Payer: Medicaid Other | Attending: Emergency Medicine | Admitting: Emergency Medicine

## 2019-02-17 ENCOUNTER — Emergency Department: Payer: Medicaid Other

## 2019-02-17 DIAGNOSIS — R05 Cough: Secondary | ICD-10-CM | POA: Insufficient documentation

## 2019-02-17 DIAGNOSIS — J101 Influenza due to other identified influenza virus with other respiratory manifestations: Secondary | ICD-10-CM

## 2019-02-17 DIAGNOSIS — F17210 Nicotine dependence, cigarettes, uncomplicated: Secondary | ICD-10-CM | POA: Insufficient documentation

## 2019-02-17 LAB — INFLUENZA PANEL BY PCR (TYPE A & B)
INFLAPCR: NEGATIVE
Influenza B By PCR: POSITIVE — AB

## 2019-02-17 MED ORDER — PROMETHAZINE-DM 6.25-15 MG/5ML PO SYRP: 5.0000 mL | ORAL_SOLUTION | Freq: Four times a day (QID) | ORAL | 0 refills | Status: DC | PRN

## 2019-02-17 MED ORDER — IBUPROFEN 600 MG PO TABS
600.0000 mg | ORAL_TABLET | Freq: Once | ORAL | Status: AC
  Administered 2019-02-17: 600 mg via ORAL
  Filled 2019-02-17: qty 1

## 2019-02-17 MED ORDER — ONDANSETRON 8 MG PO TBDP
8.0000 mg | ORAL_TABLET | Freq: Once | ORAL | Status: AC
  Administered 2019-02-17: 8 mg via ORAL
  Filled 2019-02-17: qty 1

## 2019-02-17 MED ORDER — IBUPROFEN 600 MG PO TABS: 600.0000 mg | ORAL_TABLET | Freq: Three times a day (TID) | ORAL | 0 refills | Status: DC | PRN

## 2019-02-17 MED ORDER — BENZONATATE 100 MG PO CAPS
200.0000 mg | ORAL_CAPSULE | Freq: Once | ORAL | Status: AC
  Administered 2019-02-17: 200 mg via ORAL
  Filled 2019-02-17: qty 2

## 2019-02-17 MED ORDER — OSELTAMIVIR PHOSPHATE 75 MG PO CAPS: 75.0000 mg | ORAL_CAPSULE | Freq: Two times a day (BID) | ORAL | 0 refills | Status: AC

## 2019-02-17 NOTE — ED Notes (Signed)
Pt calmly resting in bed. See triage note: c/o flu-like symptoms.

## 2019-02-17 NOTE — ED Notes (Signed)
Pt states she was swabbed for flu during triage.

## 2019-02-17 NOTE — ED Triage Notes (Signed)
Patient c/o body aches, headache, and productive cough X 3 days.

## 2019-02-17 NOTE — ED Provider Notes (Signed)
[facility] Emergency Department Provider Note   ____________________________________________   First MD Initiated Contact with Patient 02/17/19 2153     (approximate)  I have reviewed the triage vital signs and the nursing notes.   HISTORY  Chief Complaint Generalized Body Aches and Cough    HPI Susan Conrad is a 23 y.o. female patient presents with 2 to 3 days of body aches, headache, and productive cough.  Patient also has nausea and vomiting.  Patient denies diarrhea.  Patient rates her pain discomfort a 10/10.  Patient described the pain is "achy".  No palliative measure for complaint.  Patient had taken flu shot for this season.    History reviewed. No pertinent past medical history.  Patient Active Problem List   Diagnosis Date Noted  . MENORRHAGIA 09/04/2010    Past Surgical History:  Procedure Laterality Date  . NO PAST SURGERIES      Prior to Admission medications   Medication Sig Start Date End Date Taking? Authorizing Provider  amoxicillin (AMOXIL) 875 MG tablet Take 1 tablet (875 mg total) by mouth 2 (two) times daily. 12/17/18   Tommi Rumps, PA-C  fluticasone (FLONASE) 50 MCG/ACT nasal spray Place 2 sprays into both nostrils daily. 02/03/18 02/03/19  Enid Derry, PA-C  ibuprofen (ADVIL,MOTRIN) 600 MG tablet Take 1 tablet (600 mg total) by mouth every 8 (eight) hours as needed. 02/17/19   Joni Reining, PA-C  loperamide (IMODIUM A-D) 2 MG tablet Take 1 tablet (2 mg total) by mouth 4 (four) times daily as needed for diarrhea or loose stools. 02/15/17   Jene Every, MD  oseltamivir (TAMIFLU) 75 MG capsule Take 1 capsule (75 mg total) by mouth 2 (two) times daily for 5 days. 02/17/19 02/22/19  Joni Reining, PA-C  promethazine-dextromethorphan (PROMETHAZINE-DM) 6.25-15 MG/5ML syrup Take 5 mLs by mouth 4 (four) times daily as needed for cough. 02/17/19   Joni Reining, PA-C    Allergies Patient has no known  allergies.  Family History  Problem Relation Age of Onset  . Hearing loss Neg Hx     Social History Social History   Tobacco Use  . Smoking status: Current Every Day Smoker    Packs/day: 0.25    Years: 1.00    Pack years: 0.25    Types: Cigarettes  . Smokeless tobacco: Never Used  Substance Use Topics  . Alcohol use: No  . Drug use: No    Review of Systems Constitutional: Fever/chills and body aches. Eyes: No visual changes. ENT: No sore throat. Cardiovascular: Denies chest pain. Respiratory: Denies shortness of breath. Gastrointestinal: No abdominal pain.  Nausea and vomiting.  No diarrhea.  No constipation. Genitourinary: Negative for dysuria. Musculoskeletal: Positive for back pain. Skin: Negative for rash. Neurological: Negative for headaches, focal weakness or numbness.   ____________________________________________   PHYSICAL EXAM:  VITAL SIGNS: ED Triage Vitals [02/17/19 2135]  Enc Vitals Group     BP 112/63     Pulse Rate 68     Resp 17     Temp 99.5 F (37.5 C)     Temp Source Oral     SpO2 99 %     Weight 120 lb (54.4 kg)     Height 5' (1.524 m)     Head Circumference      Peak Flow      Pain Score 10     Pain Loc      Pain Edu?  Excl. in GC?     Constitutional: Alert and oriented. Well appearing and in no acute distress. Nose: Rhinorrhea. Mouth/Throat: Mucous membranes are moist.  Oropharynx non-erythematous.  Nasal drainage. Neck: No stridor.  Cardiovascular: Normal rate, regular rhythm. Grossly normal heart sounds.  Good peripheral circulation. Respiratory: Normal respiratory effort.  No retractions. Lungs CTAB. Gastrointestinal: Soft and nontender. No distention. No abdominal bruits. No CVA tenderness. Genitourinary: Deferred Musculoskeletal: No lower extremity tenderness nor edema.  No joint effusions. Neurologic:  Normal speech and language. No gross focal neurologic deficits are appreciated. No gait instability. Skin:  Skin is  warm, dry and intact. No rash noted. Psychiatric: Mood and affect are normal. Speech and behavior are normal.  ____________________________________________   LABS (all labs ordered are listed, but only abnormal results are displayed)  Labs Reviewed  INFLUENZA PANEL BY PCR (TYPE A & B) - Abnormal; Notable for the following components:      Result Value   Influenza B By PCR POSITIVE (*)    All other components within normal limits   ____________________________________________  EKG   ____________________________________________  RADIOLOGY  ED MD interpretation:    Official radiology report(s): Dg Chest 2 View  Result Date: 02/17/2019 CLINICAL DATA:  Cough, body aches, headache EXAM: CHEST - 2 VIEW COMPARISON:  09/23/2017 FINDINGS: Heart and mediastinal contours are within normal limits. No focal opacities or effusions. No acute bony abnormality. IMPRESSION: No active cardiopulmonary disease. Electronically Signed   By: Charlett Nose M.D.   On: 02/17/2019 21:51    ____________________________________________   PROCEDURES  Procedure(s) performed (including Critical Care):  Procedures   ____________________________________________   INITIAL IMPRESSION / ASSESSMENT AND PLAN / ED COURSE  As part of my medical decision making, I reviewed the following data within the electronic MEDICAL RECORD NUMBER     Patient presents with body aches, headache and productive cough.  Patient test positive influenza B.  Patient given discharge care instruction advised take medication as directed.  Patient advised follow-up PCP in 5 days.      ____________________________________________   FINAL CLINICAL IMPRESSION(S) / ED DIAGNOSES  Final diagnoses:  Influenza B     ED Discharge Orders         Ordered    oseltamivir (TAMIFLU) 75 MG capsule  2 times daily     02/17/19 2223    promethazine-dextromethorphan (PROMETHAZINE-DM) 6.25-15 MG/5ML syrup  4 times daily PRN     02/17/19 2223     ibuprofen (ADVIL,MOTRIN) 600 MG tablet  Every 8 hours PRN     02/17/19 2223           Note:  This document was prepared using Dragon voice recognition software and may include unintentional dictation errors.    Joni Reining, PA-C 02/17/19 2226    Dionne Bucy, MD 02/17/19 [phone]

## 2019-05-23 ENCOUNTER — Other Ambulatory Visit: Payer: Self-pay

## 2019-05-23 ENCOUNTER — Encounter: Payer: Self-pay | Admitting: Emergency Medicine

## 2019-05-23 ENCOUNTER — Emergency Department
Admission: EM | Admit: 2019-05-23 | Discharge: 2019-05-23 | Disposition: A | Discharge status: Home / Self Care | Payer: Medicaid Other | Attending: Emergency Medicine | Admitting: Emergency Medicine

## 2019-05-23 DIAGNOSIS — F17210 Nicotine dependence, cigarettes, uncomplicated: Secondary | ICD-10-CM | POA: Insufficient documentation

## 2019-05-23 DIAGNOSIS — R300 Dysuria: Secondary | ICD-10-CM | POA: Insufficient documentation

## 2019-05-23 DIAGNOSIS — H6981 Other specified disorders of Eustachian tube, right ear: Secondary | ICD-10-CM | POA: Insufficient documentation

## 2019-05-23 LAB — URINALYSIS, COMPLETE (UACMP) WITH MICROSCOPIC
Bacteria, UA: NONE SEEN
Bilirubin Urine: NEGATIVE
Glucose, UA: NEGATIVE mg/dL
Hgb urine dipstick: NEGATIVE
Ketones, ur: NEGATIVE mg/dL
Leukocytes,Ua: NEGATIVE
Nitrite: NEGATIVE
Protein, ur: NEGATIVE mg/dL
Specific Gravity, Urine: 1.014 (ref 1.005–1.030)
pH: 6 (ref 5.0–8.0)

## 2019-05-23 LAB — POCT PREGNANCY, URINE: Preg Test, Ur: NEGATIVE

## 2019-05-23 NOTE — ED Triage Notes (Signed)
Presents with right ear pain since yesterday  And then also developed some urinary freq and pain 2-3 days ago

## 2019-05-23 NOTE — Discharge Instructions (Addendum)
Follow-up with your primary care provider if any continued problems or no clinic if any continued problems.  Obtain Sudafed PE over-the-counter which will help with your ear pain.  Also increase fluids especially water.  You may also take Tylenol for your ear pain as needed.

## 2019-05-23 NOTE — ED Provider Notes (Signed)
[facility] Emergency Department Provider Note   ____________________________________________   First MD Initiated Contact with Patient 05/23/19 1432     (approximate)  I have reviewed the triage vital signs and the nursing notes.   HISTORY  Chief Complaint Otalgia and Dysuria   HPI [name] is a 23 y.o. female presents to the ED with complaint of right ear pain since last evening.  Patient states that her ear is throbbing and she feels pressure.  She denies any fever, chills, nausea or vomiting.  She denies any allergy symptoms.  Patient also complains of urinary frequency that started last evening.  She denies any vaginal discharge or odors.     History reviewed. No pertinent past medical history.  Patient Active Problem List   Diagnosis Date Noted  . MENORRHAGIA 09/04/2010    Past Surgical History:  Procedure Laterality Date  . NO PAST SURGERIES      Prior to Admission medications   Not on File    Allergies Patient has no known allergies.  Family History  Problem Relation Age of Onset  . Hearing loss Neg Hx     Social History Social History   Tobacco Use  . Smoking status: Current Every Day Smoker    Packs/day: 0.25    Years: 1.00    Pack years: 0.25    Types: Cigarettes  . Smokeless tobacco: Never Used  Substance Use Topics  . Alcohol use: No  . Drug use: No    Review of Systems Constitutional: No fever/chills Eyes: No visual changes. ENT: No sore throat.  Positive right ear pain. Cardiovascular: Denies chest pain. Respiratory: Denies shortness of breath. Gastrointestinal: No abdominal pain.  No nausea, no vomiting.  Genitourinary: Positive for urinary frequency x1 day.  Negative for vaginal discharge. Musculoskeletal: Negative for back pain. Skin: Negative for rash. Neurological: Negative for headaches, focal weakness or numbness. ___________________________________________   PHYSICAL EXAM:  VITAL SIGNS:  ED Triage Vitals [05/23/19 1431]  Enc Vitals Group     BP 107/65     Pulse Rate 63     Resp 18     Temp 98.7 F (37.1 C)     Temp Source Oral     SpO2 100 %     Weight 115 lb (52.2 kg)     Height 5' (1.524 m)     Head Circumference      Peak Flow      Pain Score 10     Pain Loc      Pain Edu?      Excl. in GC?    Constitutional: Alert and oriented. Well appearing and in no acute distress. Eyes: Conjunctivae are normal. PERRL. EOMI. left EAC and TM are clear.  Right EAC is clear.  TMs are dull with mild fluid present.  No erythema. Head: Atraumatic.  Nose: No congestion/rhinnorhea.   Mouth/Throat: Mucous membranes are moist.  Oropharynx non-erythematous.   Neck: No stridor.   Hematological/Lymphatic/Immunilogical: No cervical lymphadenopathy. Cardiovascular: Normal rate, regular rhythm. Grossly normal heart sounds.  Good peripheral circulation. Respiratory: Normal respiratory effort.  No retractions. Lungs CTAB. Gastrointestinal: Soft and nontender. No distention.  No CVA tenderness. Neurologic:  Normal speech and language. No gross focal neurologic deficits are appreciated. No gait instability. Skin:  Skin is warm, dry and intact. No rash noted. Psychiatric: Mood and affect are normal. Speech and behavior are normal.  ____________________________________________   LABS (all labs ordered are listed, but only abnormal results are  displayed)  Labs Reviewed  URINALYSIS, COMPLETE (UACMP) WITH MICROSCOPIC - Abnormal; Notable for the following components:      Result Value   Color, Urine YELLOW (*)    APPearance CLEAR (*)    All other components within normal limits  POC URINE PREG, ED  POCT PREGNANCY, URINE     PROCEDURES  Procedure(s) performed (including Critical Care):  Procedures   ____________________________________________   INITIAL IMPRESSION / ASSESSMENT AND PLAN / ED COURSE  As part of my medical decision making, I reviewed the following data within  the electronic MEDICAL RECORD NUMBER Notes from prior ED visits and  Controlled Substance Database  23 year old female presents to the ED with complaint of right ear pain along with urinary frequency x2 to 3 days.  Urinalysis was negative and pregnancy test negative.  Patient was made aware that she had a eustachian tube dysfunction which was most likely causing pressure on her right TM.  Patient is to obtain over-the-counter Sudafed PE, increase fluids, Tylenol if needed for pain.  Patient is to follow-up with [facility] acute care or [facility] department if any continued problems.  ____________________________________________   FINAL CLINICAL IMPRESSION(S) / ED DIAGNOSES  Final diagnoses:  Dysfunction of right eustachian tube  Dysuria     ED Discharge Orders    None       Note:  This document was prepared using Dragon voice recognition software and may include unintentional dictation errors.    Tommi Rumps, PA-C 05/23/19 1543    Shaune Pollack, MD 05/23/19 1925

## 2019-05-26 LAB — HM HIV SCREENING LAB: HM HIV Screening: NEGATIVE

## 2019-06-13 ENCOUNTER — Encounter (HOSPITAL_COMMUNITY): Payer: Self-pay

## 2019-06-13 ENCOUNTER — Encounter: Payer: Self-pay | Admitting: *Deleted

## 2019-06-13 ENCOUNTER — Emergency Department (HOSPITAL_COMMUNITY)
Admission: EM | Admit: 2019-06-13 | Discharge: 2019-06-13 | Disposition: A | Discharge status: Home / Self Care | Payer: Medicaid Other | Attending: Emergency Medicine | Admitting: Emergency Medicine

## 2019-06-13 ENCOUNTER — Other Ambulatory Visit: Payer: Self-pay

## 2019-06-13 DIAGNOSIS — Z5321 Procedure and treatment not carried out due to patient leaving prior to being seen by health care provider: Secondary | ICD-10-CM | POA: Insufficient documentation

## 2019-06-13 DIAGNOSIS — R5383 Other fatigue: Secondary | ICD-10-CM | POA: Insufficient documentation

## 2019-06-13 DIAGNOSIS — R438 Other disturbances of smell and taste: Secondary | ICD-10-CM | POA: Insufficient documentation

## 2019-06-13 DIAGNOSIS — R0989 Other specified symptoms and signs involving the circulatory and respiratory systems: Secondary | ICD-10-CM | POA: Insufficient documentation

## 2019-06-13 DIAGNOSIS — R05 Cough: Secondary | ICD-10-CM | POA: Insufficient documentation

## 2019-06-13 DIAGNOSIS — R51 Headache: Secondary | ICD-10-CM | POA: Insufficient documentation

## 2019-06-13 NOTE — ED Notes (Signed)
No labs at this time per dr sung.

## 2019-06-13 NOTE — ED Triage Notes (Addendum)
Pt reports no taste or no smell.  Pt has decreased appetite.  Sx for 3 days.  Pt was at [facility] tonight and left without seeing a doctor due to long wait time.   Pt alert.

## 2019-06-13 NOTE — ED Triage Notes (Signed)
Pt states that for the past two days she has been feeling fatigued and loss of taste, headache, and chills

## 2019-06-13 NOTE — ED Notes (Signed)
Pt told tec that she was leaving, did not want to wait any longer

## 2019-06-14 ENCOUNTER — Emergency Department
Admission: EM | Admit: 2019-06-14 | Discharge: 2019-06-14 | Disposition: A | Discharge status: Home / Self Care | Payer: Medicaid Other | Attending: Emergency Medicine | Admitting: Emergency Medicine

## 2019-06-14 NOTE — ED Notes (Signed)
Pt sitting in lobby with no distress noted, mask in place; pt reports generalized HA, nonprod cough and and congestion

## 2019-07-06 ENCOUNTER — Ambulatory Visit: Payer: Self-pay

## 2019-07-18 ENCOUNTER — Ambulatory Visit: Payer: Medicaid Other

## 2019-10-06 ENCOUNTER — Ambulatory Visit: Payer: Medicaid Other

## 2019-10-12 ENCOUNTER — Ambulatory Visit: Payer: Medicaid Other

## 2019-10-13 ENCOUNTER — Ambulatory Visit: Payer: Medicaid Other

## 2019-10-17 ENCOUNTER — Other Ambulatory Visit: Payer: Self-pay

## 2019-10-17 ENCOUNTER — Ambulatory Visit: Payer: Self-pay | Admitting: Physician Assistant

## 2019-10-17 DIAGNOSIS — Z113 Encounter for screening for infections with a predominantly sexual mode of transmission: Secondary | ICD-10-CM

## 2019-10-17 DIAGNOSIS — Z3009 Encounter for other general counseling and advice on contraception: Secondary | ICD-10-CM

## 2019-10-17 DIAGNOSIS — Z3161 Procreative counseling and advice using natural family planning: Secondary | ICD-10-CM

## 2019-10-17 LAB — WET PREP FOR TRICH, YEAST, CLUE
Trichomonas Exam: NEGATIVE
Yeast Exam: NEGATIVE

## 2019-10-17 MED ORDER — THERA VITAL M PO TABS: 1.0000 | ORAL_TABLET | Freq: Every day | ORAL | 0 refills | Status: AC

## 2019-10-18 ENCOUNTER — Encounter: Payer: Self-pay | Admitting: Physician Assistant

## 2019-10-18 NOTE — Progress Notes (Signed)
STI clinic/screening visit  Subjective:  Susan Conrad is a 23 y.o. female being seen today for an STI screening visit. The patient reports they do not have symptoms.  Patient has the following medical conditions:   Patient Active Problem List   Diagnosis Date Noted  . MENORRHAGIA 09/04/2010     Chief Complaint  Patient presents with  . SEXUALLY TRANSMITTED DISEASE    HPI  Patient reports that she is not having symptoms but would like a screening.  States that she has questions about getting pregnant.  LMP 10/09/2019 and normal.    See flowsheet for further details and programmatic requirements.    The following portions of the patient's history were reviewed and updated as appropriate: allergies, current medications, past medical history, past social history, past surgical history and problem list.  Objective:  There were no vitals filed for this visit.  Physical Exam Constitutional:      General: She is not in acute distress.    Appearance: Normal appearance.  HENT:     Head: Normocephalic and atraumatic.     Mouth/Throat:     Mouth: Mucous membranes are moist.     Pharynx: Oropharynx is clear. No oropharyngeal exudate or posterior oropharyngeal erythema.  Eyes:     Conjunctiva/sclera: Conjunctivae normal.  Neck:     Musculoskeletal: Neck supple.  Pulmonary:     Effort: Pulmonary effort is normal.  Abdominal:     Palpations: There is no mass.     Tenderness: There is no abdominal tenderness. There is no guarding or rebound.  Genitourinary:    General: Normal vulva.     Rectum: Normal.     Comments: External genitalia/pubic area without nits, lice, edema, erythema, lesions and inguinal adenopathy. Vagina with normal mucosa and discharge, pH=4.5. Cervix without visible lesions. Uterus firm, mobile, nt, no masses, no CMT, no adnexal tenderness. Lymphadenopathy:     Cervical: No cervical adenopathy.  Skin:    General: Skin is warm and dry.     Findings:  No bruising, erythema, lesion or rash.  Neurological:     Mental Status: She is alert and oriented to person, place, and time.  Psychiatric:        Mood and Affect: Mood normal.        Behavior: Behavior normal.        Thought Content: Thought content normal.        Judgment: Judgment normal.       Assessment and Plan:  [name] is a 23 y.o. female presenting to the [facility] Department for STI screening  1. Screening for STD (sexually transmitted disease) Patient into clinic without symptoms. Rec condoms with all sex. Await test results.  Counseled that RN will call if needs to RTC for any treatment once results are back. - WET PREP FOR TRICH, YEAST, CLUE - Chlamydia/Gonorrhea [place] [facility] - HIV [place] [facility] - Syphilis Serology, [place] [facility] - Multiple Vitamins-Minerals (MULTIVITAMIN) tablet; Take 1 tablet by mouth daily.  Dispense: 100 tablet; Refill: 0  2. Encounter for counseling regarding contraception Counseled patient briefly re:  Hormonal BCM options and that she can make an appt in [facility] to further discuss those. Rec MVI 1 po QD.  3. Counseling about natural family planning Patient interested in getting pregnant. Reviewed with patient and given written info on Natural Family Planning to track cycle and have sex around ovulation to increase chances of pregnancy occurring. Reviewed healthy habits for  self and partner for healthy pregnancy and baby.     No follow-ups on file.  No future appointments.  Jerene Dilling, PA

## 2019-10-21 NOTE — Progress Notes (Signed)
Wet mount reviewed and no treatment needed per standing order as pt is not having any symptoms. Pt received MVI's per pt request and provider order. Provider orders completed.Ronny Bacon, RN

## 2019-11-11 ENCOUNTER — Telehealth: Payer: Self-pay | Admitting: Family Medicine

## 2019-11-11 NOTE — Telephone Encounter (Signed)
This RN attempted to return pt's call. Call went to voicemail and message left to return call to health dept and call back phone # provided.Ronny Bacon, RN

## 2019-11-11 NOTE — Telephone Encounter (Signed)
WANTS TR °

## 2019-11-14 ENCOUNTER — Telehealth: Payer: Self-pay

## 2019-11-14 NOTE — Telephone Encounter (Signed)
TC to patient re: TR.  Verified ID via password. Discussed all results from October 2020 visit.  Aileen Fass, RN

## 2019-12-21 ENCOUNTER — Ambulatory Visit: Payer: Medicaid Other

## 2019-12-21 ENCOUNTER — Ambulatory Visit: Payer: Medicaid Other | Attending: Internal Medicine

## 2020-01-23 ENCOUNTER — Ambulatory Visit: Payer: Medicaid Other

## 2020-01-29 ENCOUNTER — Emergency Department
Admission: EM | Admit: 2020-01-29 | Discharge: 2020-01-29 | Disposition: A | Discharge status: Home / Self Care | Payer: Medicaid Other | Attending: Emergency Medicine | Admitting: Emergency Medicine

## 2020-01-29 ENCOUNTER — Other Ambulatory Visit: Payer: Self-pay

## 2020-01-29 DIAGNOSIS — F17210 Nicotine dependence, cigarettes, uncomplicated: Secondary | ICD-10-CM | POA: Insufficient documentation

## 2020-01-29 DIAGNOSIS — N3000 Acute cystitis without hematuria: Secondary | ICD-10-CM | POA: Insufficient documentation

## 2020-01-29 DIAGNOSIS — Z113 Encounter for screening for infections with a predominantly sexual mode of transmission: Secondary | ICD-10-CM

## 2020-01-29 LAB — POCT PREGNANCY, URINE: Preg Test, Ur: NEGATIVE

## 2020-01-29 LAB — COMPREHENSIVE METABOLIC PANEL
ALT: 14 U/L (ref 0–44)
AST: 17 U/L (ref 15–41)
Albumin: 4.4 g/dL (ref 3.5–5.0)
Alkaline Phosphatase: 61 U/L (ref 38–126)
Anion gap: 10 (ref 5–15)
BUN: 11 mg/dL (ref 6–20)
CO2: 27 mmol/L (ref 22–32)
Calcium: 9.7 mg/dL (ref 8.9–10.3)
Chloride: 104 mmol/L (ref 98–111)
Creatinine, Ser: 0.87 mg/dL (ref 0.44–1.00)
GFR calc Af Amer: >60 mL/min (ref >60)
GFR calc non Af Amer: >60 mL/min (ref >60)
Glucose, Bld: 92 mg/dL (ref 70–99)
Potassium: 4 mmol/L (ref 3.5–5.1)
Sodium: 141 mmol/L (ref 135–145)
Total Bilirubin: 0.6 mg/dL (ref 0.3–1.2)
Total Protein: 8.5 g/dL — ABNORMAL HIGH (ref 6.5–8.1)

## 2020-01-29 LAB — URINALYSIS, COMPLETE (UACMP) WITH MICROSCOPIC
Bilirubin Urine: NEGATIVE
Glucose, UA: NEGATIVE mg/dL
Hgb urine dipstick: NEGATIVE
Ketones, ur: NEGATIVE mg/dL
Nitrite: POSITIVE — AB
Protein, ur: NEGATIVE mg/dL
Specific Gravity, Urine: 1.021 (ref 1.005–1.030)
pH: 6 (ref 5.0–8.0)

## 2020-01-29 LAB — CBC
HCT: 41.9 % (ref 36.0–46.0)
Hemoglobin: 13.6 g/dL (ref 12.0–15.0)
MCH: 29 pg (ref 26.0–34.0)
MCHC: 32.5 g/dL (ref 30.0–36.0)
MCV: 89.3 fL (ref 80.0–100.0)
Platelets: 426 10*3/uL — ABNORMAL HIGH (ref 150–400)
RBC: 4.69 MIL/uL (ref 3.87–5.11)
RDW: 15.4 % (ref 11.5–15.5)
WBC: 9.1 10*3/uL (ref 4.0–10.5)
nRBC: 0 % (ref 0.0–0.2)

## 2020-01-29 LAB — CHLAMYDIA/NGC RT PCR (ARMC ONLY)
Chlamydia Tr: NOT DETECTED
N gonorrhoeae: NOT DETECTED

## 2020-01-29 LAB — WET PREP, GENITAL
Clue Cells Wet Prep HPF POC: NONE SEEN
Sperm: NONE SEEN
Trich, Wet Prep: NONE SEEN
Yeast Wet Prep HPF POC: NONE SEEN

## 2020-01-29 MED ORDER — AZITHROMYCIN 500 MG PO TABS
1000.0000 mg | ORAL_TABLET | Freq: Once | ORAL | Status: AC
  Administered 2020-01-29: 13:00:00 1000 mg via ORAL
  Filled 2020-01-29: qty 2

## 2020-01-29 MED ORDER — CEFTRIAXONE SODIUM 1 G IJ SOLR
1.0000 g | Freq: Once | INTRAMUSCULAR | Status: AC
  Administered 2020-01-29: 13:00:00 1 g via INTRAMUSCULAR
  Filled 2020-01-29: qty 10

## 2020-01-29 MED ORDER — CEPHALEXIN 500 MG PO CAPS: 500.0000 mg | ORAL_CAPSULE | Freq: Three times a day (TID) | ORAL | 0 refills | Status: AC

## 2020-01-29 NOTE — ED Triage Notes (Signed)
Pt presents to ED via POV, states rectal bleeding this morning after having a bowel movement. Pt states has been having rectal pain "for a little while". Pt also c/o vaginal soreness when removing tampons, states her cycle is about to end, states "you know how people say it's supposed to get brown, mine doesn't ever get like that it just stops". Pt denies further bleeding after having a bowel movement, states "it's just like sore". A&O x4, skin warm, dry, and intact, respirations even and unlabored.

## 2020-01-29 NOTE — ED Notes (Signed)
Per lab pt needs to provide a second urine sample in order to do STD testing, pt informed, pt given water

## 2020-01-29 NOTE — ED Notes (Signed)
Pt provided with urine cup, asked to provide urine sample when able, pt asked if urine sample was to check for STD's, this RN explained part of protocol, no STD tests ordered at this time, pt states "what if that's what it is?" This RN explained part of protocol orders for urine, pt placed back in lobby to await a room.

## 2020-01-29 NOTE — ED Provider Notes (Signed)
[facility] Emergency Department Provider Note  ____________________________________________  Time seen: Approximately 11:09 AM  I have reviewed the triage vital signs and the nursing notes.   HISTORY  Chief Complaint Rectal Bleeding    HPI Susan Conrad is a 24 y.o. female that presents to the emergency department for evaluation of change in color of menstrual blood today and dysuria.  Patient states that after she had a bowel movement this morning, she noticed blood on the toilet paper as well.  She is concerned that it was coming from her rectum.  Patient states that sometimes when she is having a bowel movement, her fingers and her toes will feel numb.  Patient also is on her menstrual period.  Patient states that usually her menstrual blood is usually red but today she also noticed some brown.  Patient states that she has had dysuria, urgency, frequency for a couple of days.  Patient states that her urine smells "burnt."Patient is concerned that she may have an STD.  She has a history of trichomonas and chlamydia.  No vaginal discharge.  No abdominal pain, vomiting.   Past Medical History:  Diagnosis Date  . Migraine headache     Patient Active Problem List   Diagnosis Date Noted  . MENORRHAGIA 09/04/2010    Past Surgical History:  Procedure Laterality Date  . NO PAST SURGERIES      Prior to Admission medications   Medication Sig Start Date End Date Taking? Authorizing Provider  cephALEXin (KEFLEX) 500 MG capsule Take 1 capsule (500 mg total) by mouth 3 (three) times daily for 7 days. 01/29/20 02/05/20  Enid Derry, PA-C    Allergies Patient has no known allergies.  Family History  Problem Relation Age of Onset  . Thyroid disease Maternal Grandmother   . Diabetes Maternal Grandfather   . Heart disease Maternal Grandfather   . Hypertension Maternal Grandfather   . Stroke Maternal Grandfather   . Breast cancer Paternal Grandmother   .  Hearing loss Neg Hx     Social History Social History   Tobacco Use  . Smoking status: Current Every Day Smoker    Packs/day: 0.25    Years: 1.00    Pack years: 0.25    Types: Cigarettes  . Smokeless tobacco: Never Used  Substance Use Topics  . Alcohol use: No  . Drug use: No     Review of Systems  Constitutional: No fever/chills Respiratory: No SOB. Gastrointestinal: No abdominal pain.  No nausea, no vomiting.  Genitourinary: Positive for dysuria, urgency, frequency. Musculoskeletal: Negative for musculoskeletal pain. Skin: Negative for rash, abrasions, lacerations, ecchymosis. Neurological: Negative for headaches   ____________________________________________   PHYSICAL EXAM:  VITAL SIGNS: ED Triage Vitals  Enc Vitals Group     BP 01/29/20 1020 112/60     Pulse Rate 01/29/20 1020 94     Resp 01/29/20 1020 18     Temp 01/29/20 1020 98.9 F (37.2 C)     Temp Source 01/29/20 1020 Oral     SpO2 01/29/20 1020 99 %     Weight 01/29/20 1020 115 lb (52.2 kg)     Height 01/29/20 1020 5' (1.524 m)     Head Circumference --      Peak Flow --      Pain Score 01/29/20 1023 9     Pain Loc --      Pain Edu? --      Excl. in GC? --  Constitutional: Alert and oriented. Well appearing and in no acute distress. Eyes: Conjunctivae are normal. PERRL. EOMI. Head: Atraumatic. ENT:      Ears:      Nose: No congestion/rhinnorhea.      Mouth/Throat: Mucous membranes are moist.  Neck: No stridor.  Cardiovascular: Normal rate, regular rhythm.  Good peripheral circulation. Respiratory: Normal respiratory effort without tachypnea or retractions. Lungs CTAB. Good air entry to the bases with no decreased or absent breath sounds. Gastrointestinal: Bowel sounds 4 quadrants. Soft and nontender to palpation. No guarding or rigidity. No palpable masses. No distention. No CVA tenderness. Pelvic: No external rashes or lesions. Mild vaginal blood in canal. No cervical motion  tenderness Rectal: No fissures or hemorrhoids. No blood visualized. Musculoskeletal: Full range of motion to all extremities. No gross deformities appreciated. Neurologic:  Normal speech and language. No gross focal neurologic deficits are appreciated.  Skin:  Skin is warm, dry and intact. No rash noted. Psychiatric: Mood and affect are normal. Speech and behavior are normal. Patient exhibits appropriate insight and judgement.   ____________________________________________   LABS (all labs ordered are listed, but only abnormal results are displayed)  Labs Reviewed  WET PREP, GENITAL - Abnormal; Notable for the following components:      Result Value   WBC, Wet Prep HPF POC FEW (*)    All other components within normal limits  COMPREHENSIVE METABOLIC PANEL - Abnormal; Notable for the following components:   Total Protein 8.5 (*)    All other components within normal limits  CBC - Abnormal; Notable for the following components:   Platelets 426 (*)    All other components within normal limits  URINALYSIS, COMPLETE (UACMP) WITH MICROSCOPIC - Abnormal; Notable for the following components:   Color, Urine YELLOW (*)    APPearance CLEAR (*)    Nitrite POSITIVE (*)    Leukocytes,Ua TRACE (*)    Bacteria, UA RARE (*)    All other components within normal limits  CHLAMYDIA/NGC RT PCR ([facility] ONLY)  POC URINE PREG, ED  POCT PREGNANCY, URINE   ____________________________________________  EKG   ____________________________________________  RADIOLOGY    No results found.  ____________________________________________    PROCEDURES  Procedure(s) performed:    Procedures    Medications  cefTRIAXone (ROCEPHIN) injection 1 g (1 g Intramuscular Given 01/29/20 1315)  azithromycin (ZITHROMAX) tablet 1,000 mg (1,000 mg Oral Given 01/29/20 1313)     ____________________________________________   INITIAL IMPRESSION / ASSESSMENT AND PLAN / ED COURSE  Pertinent labs  & imaging results that were available during my care of the patient were reviewed by me and considered in my medical decision making (see chart for details).  Review of the [place] CSRS was performed in accordance of the [place] prior to dispensing any controlled drugs.   Patient's diagnosis is consistent with urinary tract infection and concern for STD.  Vital signs and exam are reassuring.  Urinalysis consistent with urinary tract infection.  Gonorrhea and Chlamydia tests are pending.  Patient was treated empirically for gonorrhea and chlamydia.  Hemoccult test is negative for rectal bleeding.  Patient denies any abdominal pain.  Patient will be discharged home with prescriptions for keflex. Patient is to follow up with PCP as directed. Patient is given ED precautions to return to the ED for any worsening or new symptoms.  NICHOLA CIESLINSKI was evaluated in Emergency Department on 01/29/2020 for the symptoms described in the history of present illness. She was evaluated in the context of the global  COVID-19 pandemic, which necessitated consideration that the patient might be at risk for infection with the SARS-CoV-2 virus that causes COVID-19. Institutional protocols and algorithms that pertain to the evaluation of patients at risk for COVID-19 are in a state of rapid change based on information released by regulatory bodies including the CDC and federal and state organizations. These policies and algorithms were followed during the patient's care in the ED.   ____________________________________________  FINAL CLINICAL IMPRESSION(S) / ED DIAGNOSES  Final diagnoses:  Screening for STD (sexually transmitted disease)  Acute cystitis without hematuria      NEW MEDICATIONS STARTED DURING THIS VISIT:  ED Discharge Orders         Ordered    cephALEXin (KEFLEX) 500 MG capsule  3 times daily     01/29/20 1304              This chart was dictated using voice recognition software/Dragon. Despite  best efforts to proofread, errors can occur which can change the meaning. Any change was purely unintentional.    Enid Derry, PA-C 01/29/20 1712    Dionne Bucy, MD 02/02/20 [phone]

## 2020-01-29 NOTE — ED Notes (Signed)
Reports discoloration, reports pain when pulling her tampon out. Patient also reports pain when voiding. Awaiting urine specimen and Md eval.

## 2020-04-02 ENCOUNTER — Ambulatory Visit: Payer: Medicaid Other

## 2020-04-05 ENCOUNTER — Ambulatory Visit: Payer: Medicaid Other

## 2020-05-10 ENCOUNTER — Ambulatory Visit: Payer: Medicaid Other

## 2020-05-10 ENCOUNTER — Emergency Department
Admission: EM | Admit: 2020-05-10 | Discharge: 2020-05-10 | Disposition: A | Discharge status: Home / Self Care | Payer: Medicaid Other | Attending: Emergency Medicine | Admitting: Emergency Medicine

## 2020-05-10 ENCOUNTER — Other Ambulatory Visit: Payer: Self-pay

## 2020-05-10 ENCOUNTER — Encounter: Payer: Self-pay | Admitting: Emergency Medicine

## 2020-05-10 DIAGNOSIS — H9202 Otalgia, left ear: Secondary | ICD-10-CM

## 2020-05-10 DIAGNOSIS — Z8616 Personal history of COVID-19: Secondary | ICD-10-CM | POA: Insufficient documentation

## 2020-05-10 DIAGNOSIS — F17210 Nicotine dependence, cigarettes, uncomplicated: Secondary | ICD-10-CM | POA: Insufficient documentation

## 2020-05-10 DIAGNOSIS — J069 Acute upper respiratory infection, unspecified: Secondary | ICD-10-CM

## 2020-05-10 LAB — SARS CORONAVIRUS 2 BY RT PCR (HOSPITAL ORDER, PERFORMED IN ~~LOC~~ HOSPITAL LAB): SARS Coronavirus 2: NEGATIVE

## 2020-05-10 MED ORDER — AMOXICILLIN-POT CLAVULANATE 875-125 MG PO TABS: 1.0000 | ORAL_TABLET | Freq: Two times a day (BID) | ORAL | 0 refills | Status: AC

## 2020-05-10 NOTE — ED Provider Notes (Signed)
[facility] Emergency Department Provider Note  Time seen: 3:53 AM  I have reviewed the triage vital signs and the nursing notes.   HISTORY  Chief Complaint Otalgia   HPI Susan Conrad is a 24 y.o. female with no past medical history presents to the emergency department for cough, left ear pain and sore throat.  According to the patient for the past 2 days she has had a cough sore throat and some left ear fullness.  Patient denies any known fever.  Is not been vaccinated for Covid but states she contracted Covid approximately 1 year ago.  Denies any vomiting.  No abdominal pain.   Past Medical History:  Diagnosis Date  . Migraine headache     Patient Active Problem List   Diagnosis Date Noted  . MENORRHAGIA 09/04/2010    Past Surgical History:  Procedure Laterality Date  . NO PAST SURGERIES      Prior to Admission medications   Not on File    No Known Allergies  Family History  Problem Relation Age of Onset  . Thyroid disease Maternal Grandmother   . Diabetes Maternal Grandfather   . Heart disease Maternal Grandfather   . Hypertension Maternal Grandfather   . Stroke Maternal Grandfather   . Breast cancer Paternal Grandmother   . Hearing loss Neg Hx     Social History Social History   Tobacco Use  . Smoking status: Current Every Day Smoker    Packs/day: 0.25    Years: 1.00    Pack years: 0.25    Types: Cigarettes  . Smokeless tobacco: Never Used  Substance Use Topics  . Alcohol use: No  . Drug use: No    Review of Systems Constitutional: Negative for fever. ENT: Left ear fullness.  Positive for sore throat. Cardiovascular: Negative for chest pain. Respiratory: Negative for shortness of breath.  Positive for cough. Gastrointestinal: Negative for abdominal pain Neurological: Negative for headache All other ROS negative  ____________________________________________   PHYSICAL EXAM:  VITAL SIGNS: ED Triage Vitals  Enc  Vitals Group     BP 05/10/20 0229 120/66     Pulse Rate 05/10/20 0229 (!) 58     Resp 05/10/20 0229 18     Temp 05/10/20 0229 98.4 F (36.9 C)     Temp Source 05/10/20 0229 Oral     SpO2 05/10/20 0229 98 %     Weight 05/10/20 0228 115 lb (52.2 kg)     Height 05/10/20 0228 5' (1.524 m)     Head Circumference --      Peak Flow --      Pain Score 05/10/20 0228 10     Pain Loc --      Pain Edu? --      Excl. in GC? --     Constitutional: Alert and oriented. Well appearing and in no distress. Eyes: Normal exam ENT      Head: Normocephalic and atraumatic.  Slight erythema of left tympanic membrane.  Normal right tympanic membrane.      Nose: Moderate congestion      Mouth/Throat: Mild pharyngeal erythema but no exudate or tonsillar hypertrophy. Cardiovascular: Normal rate, regular rhythm. Respiratory: Normal respiratory effort without tachypnea nor retractions. Breath sounds are clear  Gastrointestinal: Soft and nontender. No distention. Musculoskeletal: Nontender with normal range of motion in all extremities.  Neurologic:  Normal speech and language. No gross focal neurologic deficits Skin:  Skin is warm, dry and intact.  Psychiatric: Mood  and affect are normal.    INITIAL IMPRESSION / ASSESSMENT AND PLAN / ED COURSE  Pertinent labs & imaging results that were available during my care of the patient were reviewed by me and considered in my medical decision making (see chart for details).   Patient presents emergency department with complaints of a sore throat, cough, left ear fullness.  Overall patient appears well.  Given the patient's symptoms we will swab for Covid as a precaution although the patient states she contracted Covid 1 year ago.  Given the patient's left ear fullness with mild erythema on exam we will cover with antibiotics and have the patient follow-up with her doctor as needed.  Suspect viral upper respiratory infection.  EVANGELINA Conrad was evaluated in Emergency  Department on 05/10/2020 for the symptoms described in the history of present illness. She was evaluated in the context of the global COVID-19 pandemic, which necessitated consideration that the patient might be at risk for infection with the SARS-CoV-2 virus that causes COVID-19. Institutional protocols and algorithms that pertain to the evaluation of patients at risk for COVID-19 are in a state of rapid change based on information released by regulatory bodies including the CDC and federal and state organizations. These policies and algorithms were followed during the patient's care in the ED.  ____________________________________________   FINAL CLINICAL IMPRESSION(S) / ED DIAGNOSES  Upper respiratory infection   Harvest Dark, MD 05/10/20 [phone]

## 2020-05-10 NOTE — ED Triage Notes (Signed)
Patient ambulatory to triage with steady gait, without difficulty or distress noted, mask in place; pt reports left ear pain, HA and sore throat, sinus congestion since Friday

## 2020-05-10 NOTE — ED Notes (Signed)
Patient discharged to home per MD order. Patient in stable condition, and deemed medically cleared by ED provider for discharge. Discharge instructions reviewed with patient/family using "Teach Back"; verbalized understanding of medication education and administration, and information about follow-up care. Denies further concerns. ° °

## 2020-06-01 ENCOUNTER — Other Ambulatory Visit: Payer: Self-pay

## 2020-06-01 ENCOUNTER — Emergency Department: Payer: Medicaid Other

## 2020-06-01 ENCOUNTER — Encounter: Payer: Self-pay | Admitting: Intensive Care

## 2020-06-01 ENCOUNTER — Encounter: Payer: Self-pay | Admitting: Advanced Practice Midwife

## 2020-06-01 ENCOUNTER — Ambulatory Visit: Payer: Self-pay | Admitting: Advanced Practice Midwife

## 2020-06-01 ENCOUNTER — Emergency Department
Admission: EM | Admit: 2020-06-01 | Discharge: 2020-06-01 | Disposition: A | Discharge status: Home / Self Care | Payer: Medicaid Other | Attending: Emergency Medicine | Admitting: Emergency Medicine

## 2020-06-01 DIAGNOSIS — M79601 Pain in right arm: Secondary | ICD-10-CM | POA: Insufficient documentation

## 2020-06-01 DIAGNOSIS — Z113 Encounter for screening for infections with a predominantly sexual mode of transmission: Secondary | ICD-10-CM

## 2020-06-01 DIAGNOSIS — S0083XA Contusion of other part of head, initial encounter: Secondary | ICD-10-CM | POA: Insufficient documentation

## 2020-06-01 DIAGNOSIS — Z5321 Procedure and treatment not carried out due to patient leaving prior to being seen by health care provider: Secondary | ICD-10-CM | POA: Insufficient documentation

## 2020-06-01 DIAGNOSIS — Y9389 Activity, other specified: Secondary | ICD-10-CM | POA: Insufficient documentation

## 2020-06-01 DIAGNOSIS — F17200 Nicotine dependence, unspecified, uncomplicated: Secondary | ICD-10-CM | POA: Insufficient documentation

## 2020-06-01 DIAGNOSIS — Y999 Unspecified external cause status: Secondary | ICD-10-CM | POA: Insufficient documentation

## 2020-06-01 DIAGNOSIS — F1290 Cannabis use, unspecified, uncomplicated: Secondary | ICD-10-CM

## 2020-06-01 DIAGNOSIS — Y929 Unspecified place or not applicable: Secondary | ICD-10-CM | POA: Insufficient documentation

## 2020-06-01 DIAGNOSIS — R55 Syncope and collapse: Secondary | ICD-10-CM | POA: Insufficient documentation

## 2020-06-01 DIAGNOSIS — O99332 Smoking (tobacco) complicating pregnancy, second trimester: Secondary | ICD-10-CM | POA: Insufficient documentation

## 2020-06-01 HISTORY — DX: Cannabis use, unspecified, uncomplicated: F12.90

## 2020-06-01 LAB — BASIC METABOLIC PANEL
Anion gap: 8 (ref 5–15)
BUN: 7 mg/dL (ref 6–20)
CO2: 27 mmol/L (ref 22–32)
Calcium: 9.4 mg/dL (ref 8.9–10.3)
Chloride: 103 mmol/L (ref 98–111)
Creatinine, Ser: 0.98 mg/dL (ref 0.44–1.00)
GFR calc Af Amer: >60 mL/min (ref >60)
GFR calc non Af Amer: >60 mL/min (ref >60)
Glucose, Bld: 95 mg/dL (ref 70–99)
Potassium: 3.2 mmol/L — ABNORMAL LOW (ref 3.5–5.1)
Sodium: 138 mmol/L (ref 135–145)

## 2020-06-01 LAB — CBC
HCT: 35 % — ABNORMAL LOW (ref 36.0–46.0)
Hemoglobin: 11.9 g/dL — ABNORMAL LOW (ref 12.0–15.0)
MCH: 29.6 pg (ref 26.0–34.0)
MCHC: 34 g/dL (ref 30.0–36.0)
MCV: 87.1 fL (ref 80.0–100.0)
Platelets: 400 10*3/uL (ref 150–400)
RBC: 4.02 MIL/uL (ref 3.87–5.11)
RDW: 15.1 % (ref 11.5–15.5)
WBC: 12.1 10*3/uL — ABNORMAL HIGH (ref 4.0–10.5)
nRBC: 0 % (ref 0.0–0.2)

## 2020-06-01 MED ORDER — MULTIVITAMINS PO CAPS: 1.0000 | ORAL_CAPSULE | Freq: Every day | ORAL | 0 refills | Status: AC
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
## 2020-06-01 NOTE — ED Notes (Signed)
Patient transported to CT 

## 2020-06-01 NOTE — Progress Notes (Signed)
[place]et [place] results reviewed. Per standing orders no treatment indicated. Ester Mabe, RN  
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
## 2020-06-01 NOTE — Progress Notes (Signed)
Here today for STD screening. Accepts bloodwork. Plans pregnancy in the next year. MVI's accepted. Tawny Hopping, RN

## 2020-06-01 NOTE — Progress Notes (Signed)
[facility] Department STI clinic/screening visit  Subjective:  Susan Conrad is a 24 y.o. female being seen today for an STI screening visit. The patient reports they do not have symptoms.  Patient reports that they do not desire a pregnancy in the next year.   They reported they are not interested in discussing contraception today.  Patient's last menstrual period was 05/18/2020 (exact date).   Patient has the following medical conditions:   Patient Active Problem List   Diagnosis Date Noted   Smoker 1/2-3/4 ppd 06/01/2020   Marijuana use daily 06/01/2020   MENORRHAGIA 09/04/2010    Chief Complaint  Patient presents with   SEXUALLY TRANSMITTED DISEASE    HPI  Patient reports LMP 05/18/20.  Last sex 05/28/20 with condom; with partner x 3 mo.  Last MJ yesterday.  Last ETOH last night (1/5 Peggye Form) "once in a blue moon".  Last HIV test per patient/review of record was ? Patient reports last pap was unknown  See flowsheet for further details and programmatic requirements.    The following portions of the patient's history were reviewed and updated as appropriate: allergies, current medications, past medical history, past social history, past surgical history and problem list.  Objective:  There were no vitals filed for this visit.  Physical Exam Vitals and nursing note reviewed.  Constitutional:      Appearance: Normal appearance.  HENT:     Head: Normocephalic and atraumatic.     Mouth/Throat:     Mouth: Mucous membranes are moist.     Pharynx: Oropharynx is clear. No oropharyngeal exudate or posterior oropharyngeal erythema.  Eyes:     Conjunctiva/sclera: Conjunctivae normal.  Pulmonary:     Effort: Pulmonary effort is normal.  Abdominal:     General: Abdomen is flat.     Palpations: Abdomen is soft. There is no mass.     Tenderness: There is no abdominal tenderness. There is no rebound.     Comments: Scaphoid, soft without tenderness   Genitourinary:    General: Normal vulva.     Exam position: Lithotomy position.     Pubic Area: No rash or pubic lice.      Labia:        Right: No rash or lesion.        Left: No rash or lesion.      Vagina: Vaginal discharge (white thick leukorrhea, ph>4.5) present. No erythema, bleeding or lesions.     Cervix: Normal.     Uterus: Normal.      Adnexa: Right adnexa normal and left adnexa normal.     Rectum: Normal.  Lymphadenopathy:     Head:     Right side of head: No preauricular or posterior auricular adenopathy.     Left side of head: No preauricular or posterior auricular adenopathy.     Cervical: No cervical adenopathy.     Upper Body:     Right upper body: No supraclavicular or axillary adenopathy.     Left upper body: No supraclavicular or axillary adenopathy.     Lower Body: No right inguinal adenopathy. No left inguinal adenopathy.  Skin:    General: Skin is warm and dry.     Findings: No rash.  Neurological:     Mental Status: She is alert and oriented to person, place, and time.      Assessment and Plan:  Susan Conrad is a 24 y.o. female presenting to the [facility] Department for STI  screening  1. Screening examination for venereal disease Treat wet mount per standing orders Immunization nurse consult Please give pt [name]'s contact info - Multiple Vitamin (MULTIVITAMIN) capsule; Take 1 capsule by mouth daily for 100 doses.  Dispense: 100 capsule; Refill: 0 - WET PREP FOR TRICH, YEAST, CLUE - Syphilis Serology, [place] [facility] - HIV [place] [facility] - Chlamydia/Gonorrhea  Lab - Ambulatory referral to Behavioral Health  2. Smoker 1/2-3/4 ppd Counseled via 5 A's to stop smoking  3. Marijuana use daily      Return if symptoms worsen or fail to improve.  No future appointments.  Herbie Saxon, CNM

## 2020-06-04 ENCOUNTER — Telehealth: Payer: Self-pay | Admitting: Family Medicine

## 2020-06-04 LAB — WET PREP FOR TRICH, YEAST, CLUE
Trichomonas Exam: NEGATIVE
Yeast Exam: NEGATIVE

## 2020-06-04 NOTE — Telephone Encounter (Signed)
Phone call to pt. Pt is receiving MyChart messages stating she has results in. Pt counseled that they may be from hospital visit, ours are not in yet; could be up to 3 weeks and we call only if positive results or problems with test.

## 2020-06-13 ENCOUNTER — Ambulatory Visit: Payer: Medicaid Other | Admitting: Licensed Clinical Social Worker

## 2020-08-03 ENCOUNTER — Ambulatory Visit: Payer: Medicaid Other

## 2020-08-18 ENCOUNTER — Emergency Department: Payer: Medicaid Other

## 2020-08-18 ENCOUNTER — Emergency Department
Admission: EM | Admit: 2020-08-18 | Discharge: 2020-08-18 | Disposition: A | Discharge status: Home / Self Care | Payer: Medicaid Other | Attending: Student in an Organized Health Care Education/Training Program | Admitting: Student in an Organized Health Care Education/Training Program

## 2020-08-18 ENCOUNTER — Other Ambulatory Visit: Payer: Self-pay

## 2020-08-18 DIAGNOSIS — F17210 Nicotine dependence, cigarettes, uncomplicated: Secondary | ICD-10-CM | POA: Insufficient documentation

## 2020-08-18 DIAGNOSIS — Y999 Unspecified external cause status: Secondary | ICD-10-CM | POA: Insufficient documentation

## 2020-08-18 DIAGNOSIS — F10129 Alcohol abuse with intoxication, unspecified: Secondary | ICD-10-CM | POA: Insufficient documentation

## 2020-08-18 DIAGNOSIS — S83402A Sprain of unspecified collateral ligament of left knee, initial encounter: Secondary | ICD-10-CM

## 2020-08-18 DIAGNOSIS — W010XXA Fall on same level from slipping, tripping and stumbling without subsequent striking against object, initial encounter: Secondary | ICD-10-CM | POA: Insufficient documentation

## 2020-08-18 DIAGNOSIS — Y9289 Other specified places as the place of occurrence of the external cause: Secondary | ICD-10-CM | POA: Insufficient documentation

## 2020-08-18 DIAGNOSIS — Y939 Activity, unspecified: Secondary | ICD-10-CM | POA: Insufficient documentation

## 2020-08-18 MED ORDER — MELOXICAM 15 MG PO TABS
15.0000 mg | ORAL_TABLET | Freq: Every day | ORAL | 2 refills | Status: AC
Start: 2020-08-18 — End: 2021-08-18
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
## 2020-08-18 NOTE — ED Notes (Addendum)
Pt states that she had her knee twisted when someone fell on her. Pt states she tried to stand on it but that it's very painful. NAD noted.

## 2020-08-18 NOTE — Discharge Instructions (Addendum)
Follow-up with orthopedics.  Take the meloxicam daily.  Can also take Tylenol.  Apply ice to the left knee.  Return if worsening

## 2020-08-18 NOTE — ED Triage Notes (Signed)
Pt arrived via POV with reports of left knee pain after twisting it last night. Pt states the pain is worse today than yesterday.  Pt reports swelling that has improved. Pt has not taken anything for pain.

## 2020-11-12 ENCOUNTER — Emergency Department: Payer: Medicaid Other

## 2020-11-12 ENCOUNTER — Emergency Department
Admission: EM | Admit: 2020-11-12 | Discharge: 2020-11-12 | Disposition: A | Discharge status: Home / Self Care | Payer: Medicaid Other | Attending: Emergency Medicine | Admitting: Emergency Medicine

## 2020-11-12 ENCOUNTER — Other Ambulatory Visit: Payer: Self-pay

## 2020-11-12 DIAGNOSIS — F17210 Nicotine dependence, cigarettes, uncomplicated: Secondary | ICD-10-CM | POA: Insufficient documentation

## 2020-11-12 DIAGNOSIS — N739 Female pelvic inflammatory disease, unspecified: Secondary | ICD-10-CM | POA: Insufficient documentation

## 2020-11-12 DIAGNOSIS — Z20822 Contact with and (suspected) exposure to covid-19: Secondary | ICD-10-CM | POA: Insufficient documentation

## 2020-11-12 DIAGNOSIS — R109 Unspecified abdominal pain: Secondary | ICD-10-CM

## 2020-11-12 DIAGNOSIS — N939 Abnormal uterine and vaginal bleeding, unspecified: Secondary | ICD-10-CM | POA: Insufficient documentation

## 2020-11-12 DIAGNOSIS — N3001 Acute cystitis with hematuria: Secondary | ICD-10-CM

## 2020-11-12 DIAGNOSIS — N730 Acute parametritis and pelvic cellulitis: Secondary | ICD-10-CM

## 2020-11-12 LAB — URINALYSIS, COMPLETE (UACMP) WITH MICROSCOPIC
Bacteria, UA: NONE SEEN
Bilirubin Urine: NEGATIVE
Glucose, UA: NEGATIVE mg/dL
Ketones, ur: NEGATIVE mg/dL
Nitrite: NEGATIVE
Protein, ur: 30 mg/dL — AB
RBC / HPF: >50 RBC/hpf — ABNORMAL HIGH (ref 0–5)
Specific Gravity, Urine: 1.031 — ABNORMAL HIGH (ref 1.005–1.030)
pH: 5 (ref 5.0–8.0)

## 2020-11-12 LAB — HEPATITIS PANEL, ACUTE
HCV Ab: NONREACTIVE
Hep A IgM: NONREACTIVE
Hep B C IgM: NONREACTIVE
Hepatitis B Surface Ag: NONREACTIVE

## 2020-11-12 LAB — WET PREP, GENITAL
Clue Cells Wet Prep HPF POC: NONE SEEN
Sperm: NONE SEEN
Trich, Wet Prep: NONE SEEN
Yeast Wet Prep HPF POC: NONE SEEN

## 2020-11-12 LAB — CBC
HCT: 41.4 % (ref 36.0–46.0)
Hemoglobin: 13.7 g/dL (ref 12.0–15.0)
MCH: 29.6 pg (ref 26.0–34.0)
MCHC: 33.1 g/dL (ref 30.0–36.0)
MCV: 89.4 fL (ref 80.0–100.0)
Platelets: 369 10*3/uL (ref 150–400)
RBC: 4.63 MIL/uL (ref 3.87–5.11)
RDW: 15.1 % (ref 11.5–15.5)
WBC: 9.1 10*3/uL (ref 4.0–10.5)
nRBC: 0 % (ref 0.0–0.2)

## 2020-11-12 LAB — COMPREHENSIVE METABOLIC PANEL
ALT: 14 U/L (ref 0–44)
AST: 19 U/L (ref 15–41)
Albumin: 4.7 g/dL (ref 3.5–5.0)
Alkaline Phosphatase: 53 U/L (ref 38–126)
Anion gap: 11 (ref 5–15)
BUN: 9 mg/dL (ref 6–20)
CO2: 28 mmol/L (ref 22–32)
Calcium: 9.6 mg/dL (ref 8.9–10.3)
Chloride: 101 mmol/L (ref 98–111)
Creatinine, Ser: 0.81 mg/dL (ref 0.44–1.00)
GFR, Estimated: >60 mL/min (ref >60)
Glucose, Bld: 106 mg/dL — ABNORMAL HIGH (ref 70–99)
Potassium: 3.6 mmol/L (ref 3.5–5.1)
Sodium: 140 mmol/L (ref 135–145)
Total Bilirubin: 0.4 mg/dL (ref 0.3–1.2)
Total Protein: 8.5 g/dL — ABNORMAL HIGH (ref 6.5–8.1)

## 2020-11-12 LAB — CHLAMYDIA/NGC RT PCR (ARMC ONLY)
Chlamydia Tr: NOT DETECTED
N gonorrhoeae: NOT DETECTED

## 2020-11-12 LAB — LIPASE, BLOOD: Lipase: 34 U/L (ref 11–51)

## 2020-11-12 LAB — POC URINE PREG, ED: Preg Test, Ur: NEGATIVE

## 2020-11-12 LAB — HIV ANTIBODY (ROUTINE TESTING W REFLEX): HIV Screen 4th Generation wRfx: NONREACTIVE

## 2020-11-12 MED ORDER — DOXYCYCLINE HYCLATE 100 MG PO CAPS
100.0000 mg | ORAL_CAPSULE | Freq: Two times a day (BID) | ORAL | 0 refills | Status: AC
Start: 2020-11-12 — End: 2020-11-26

## 2020-11-12 MED ORDER — KETOROLAC TROMETHAMINE 30 MG/ML IJ SOLN
30.0000 mg | Freq: Once | INTRAMUSCULAR | Status: AC
  Administered 2020-11-12: 30 mg via INTRAVENOUS
  Filled 2020-11-12: qty 1

## 2020-11-12 MED ORDER — ACETAMINOPHEN 500 MG PO TABS
1000.0000 mg | ORAL_TABLET | Freq: Once | ORAL | Status: AC
  Administered 2020-11-12: 1000 mg via ORAL
  Filled 2020-11-12: qty 2

## 2020-11-12 MED ORDER — SODIUM CHLORIDE 0.9 % IV SOLN
1.0000 g | Freq: Once | INTRAVENOUS | Status: AC
  Administered 2020-11-12: 1 g via INTRAVENOUS
  Filled 2020-11-12: qty 10

## 2020-11-12 MED ORDER — ONDANSETRON 4 MG PO TBDP
4.0000 mg | ORAL_TABLET | Freq: Once | ORAL | Status: AC
  Administered 2020-11-12: 4 mg via ORAL
  Filled 2020-11-12: qty 1

## 2020-11-12 NOTE — ED Notes (Signed)
Patient verbalizes understanding of discharge instructions. Opportunity for questioning and answers were provided. Armband removed by staff, pt discharged from ED. Ambulated out to lobby  

## 2020-11-12 NOTE — ED Notes (Signed)
Pt just went ot [place] will be gone for 20-30 minutes

## 2020-11-12 NOTE — ED Triage Notes (Signed)
Patient reports having lower abdominal pain.  Reports was 3 days late for menstrual then began spotting but it was really dark.  Patient also reports having nausea.

## 2020-11-12 NOTE — ED Provider Notes (Signed)
[facility] Emergency Department Provider Note  ____________________________________________   First MD Initiated Contact with Patient 11/12/20 [phone]     (approximate)  I have reviewed the triage vital signs and the nursing notes.   HISTORY  Chief Complaint Abdominal Pain   HPI Susan Conrad is a 24 y.o. female with past medical history of migraines who presents for assessment of approximately 3 days of some vaginal spotting and abnormal discharge associate with some suprapubic abdominal pain and bilateral lower back pain. Patient states her periods are somewhat irregular and that she started spotting couple days after she was expecting to start her period. She states that she has had some nausea and vomiting over the past month as well but no diarrhea, shortness of breath, cough, fevers, headache or earache, sore throat, rash, or extremity pain. She is no blood in her urine but does have a little bit of burning when she pees. No blood in her stool. No prior synopsis. No clear alleviating or aggravating factors.         Past Medical History:  Diagnosis Date  . Migraine headache     Patient Active Problem List   Diagnosis Date Noted  . Smoker 1/2-3/4 ppd 06/01/2020  . Marijuana use daily 06/01/2020  . MENORRHAGIA 09/04/2010    Past Surgical History:  Procedure Laterality Date  . NO PAST SURGERIES      Prior to Admission medications   Medication Sig Start Date End Date Taking? Authorizing Provider  doxycycline (VIBRAMYCIN) 100 MG capsule Take 1 capsule (100 mg total) by mouth 2 (two) times daily for 14 days. 11/12/20 11/26/20  Gilles ChiquitoSmith, Madden Piazza P, MD  meloxicam (MOBIC) 15 MG tablet Take 1 tablet (15 mg total) by mouth daily. 08/18/20 08/18/21  Faythe GheeFisher, Susan W, PA-C    Allergies Patient has no known allergies.  Family History  Problem Relation Age of Onset  . Thyroid disease Maternal Grandmother   . Diabetes Maternal Grandfather   . Heart disease  Maternal Grandfather   . Hypertension Maternal Grandfather   . Stroke Maternal Grandfather   . Breast cancer Paternal Grandmother   . Hearing loss Neg Hx     Social History Social History   Tobacco Use  . Smoking status: Current Every Day Smoker    Packs/day: 0.25    Years: 1.00    Pack years: 0.25    Types: Cigarettes  . Smokeless tobacco: Never Used  Vaping Use  . Vaping Use: Never used  Substance Use Topics  . Alcohol use: No  . Drug use: No    Review of Systems  Review of Systems  Constitutional: Negative for chills and fever.  HENT: Negative for sore throat.   Eyes: Negative for pain.  Respiratory: Negative for cough and stridor.   Cardiovascular: Negative for chest pain.  Gastrointestinal: Positive for abdominal pain, nausea and vomiting.  Genitourinary: Positive for dysuria and frequency.  Musculoskeletal: Positive for back pain.  Skin: Negative for rash.  Neurological: Negative for seizures, loss of consciousness and headaches.  Psychiatric/Behavioral: Negative for suicidal ideas.  All other systems reviewed and are negative.     ____________________________________________   PHYSICAL EXAM:  VITAL SIGNS: ED Triage Vitals  Enc Vitals Group     BP 11/12/20 0144 129/78     Pulse Rate 11/12/20 0144 76     Resp 11/12/20 0144 18     Temp 11/12/20 0144 99.1 F (37.3 C)     Temp Source 11/12/20 0144 Oral  SpO2 11/12/20 0144 100 %     Weight 11/12/20 0145 110 lb (49.9 kg)     Height 11/12/20 0145 5' (1.524 m)     Head Circumference --      Peak Flow --      Pain Score 11/12/20 0145 10     Pain Loc --      Pain Edu? --      Excl. in GC? --    Vitals:   11/12/20 0144  BP: 129/78  Pulse: 76  Resp: 18  Temp: 99.1 F (37.3 C)  SpO2: 100%   Physical Exam Vitals and nursing note reviewed. Exam conducted with a chaperone present.  Constitutional:      General: She is not in acute distress.    Appearance: She is well-developed.  HENT:      Head: Normocephalic and atraumatic.     Right Ear: External ear normal.     Left Ear: External ear normal.     Nose: Nose normal.  Eyes:     Conjunctiva/sclera: Conjunctivae normal.  Cardiovascular:     Rate and Rhythm: Normal rate and regular rhythm.     Heart sounds: No murmur heard.   Pulmonary:     Effort: Pulmonary effort is normal. No respiratory distress.     Breath sounds: Normal breath sounds.  Abdominal:     Palpations: Abdomen is soft.     Tenderness: There is abdominal tenderness in the suprapubic area. There is no right CVA tenderness or left CVA tenderness.  Genitourinary:    Cervix: Cervical motion tenderness, friability, erythema and cervical bleeding present.  Musculoskeletal:     Cervical back: Neck supple.  Skin:    General: Skin is warm and dry.     Capillary Refill: Capillary refill takes less than 2 seconds.  Neurological:     Mental Status: She is alert and oriented to person, place, and time.  Psychiatric:        Mood and Affect: Mood normal.      ____________________________________________   LABS (all labs ordered are listed, but only abnormal results are displayed)  Labs Reviewed  WET PREP, GENITAL - Abnormal; Notable for the following components:      Result Value   WBC, Wet Prep HPF POC FEW (*)    All other components within normal limits  COMPREHENSIVE METABOLIC PANEL - Abnormal; Notable for the following components:   Glucose, Bld 106 (*)    Total Protein 8.5 (*)    All other components within normal limits  URINALYSIS, COMPLETE (UACMP) WITH MICROSCOPIC - Abnormal; Notable for the following components:   Color, Urine YELLOW (*)    APPearance CLOUDY (*)    Specific Gravity, Urine 1.031 (*)    Hgb urine dipstick LARGE (*)    Protein, ur 30 (*)    Leukocytes,Ua SMALL (*)    RBC / HPF >50 (*)    All other components within normal limits  URINE CULTURE  CHLAMYDIA/NGC RT PCR ([facility] ONLY)  LIPASE, BLOOD  CBC  HIV ANTIBODY  (ROUTINE TESTING W REFLEX)  RPR  HEPATITIS PANEL, ACUTE  POC URINE PREG, ED   ____________________________________________   ____________________________________________  RADIOLOGY   Official radiology report(s): US PELVIS (TRANSABDOMINAL ONLY)  Result Date: 11/12/2020 CLINICAL DATA:  Initial evaluation for acute lower pelvic pain, cramping. EXAM: TRANSABDOMINAL ULTRASOUND OF PELVIS DOPPLER ULTRASOUND OF OVARIES TECHNIQUE: Transabdominal ultrasound examination of the pelvis was performed including evaluation of the uterus, ovaries, adnexal regions, and pelvic cul-de-sac.  Color and duplex Doppler ultrasound was utilized to evaluate blood flow to the ovaries. COMPARISON:  Prior CT from 04/08/2015. FINDINGS: Uterus Measurements: 6.4 x 3.5 x 4.3 cm = volume: 51 mL. Uterus is anteflexed. 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 No discrete fibroid or other mass. Endometrium Thickness: 5.4 mm.  No focal abnormality visualized. Right ovary Measurements: 3.2 x 1.7 x 3.0 cm = volume: 8.5 mL. Normal appearance/no adnexal mass. Left ovary Measurements: 4.4 x 1.6 x 2.5 cm = volume: 9.0 mL. Normal appearance/no adnexal mass. Pulsed Doppler evaluation demonstrates normal low-resistance arterial and venous waveforms in both ovaries. Other: No free fluid within the pelvis. IMPRESSION: Normal pelvic ultrasound. No evidence for ovarian torsion or other acute abnormality. Electronically Signed   By: Rise Mu M.D.   On: 11/12/2020 03:38   US PELVIC DOPPLER (TORSION R/O OR MASS ARTERIAL FLOW)  Result Date: 11/12/2020 CLINICAL DATA:  Initial evaluation for acute lower pelvic pain, cramping. EXAM: TRANSABDOMINAL ULTRASOUND OF PELVIS DOPPLER ULTRASOUND OF OVARIES TECHNIQUE: Transabdominal ultrasound examination of the pelvis was performed including evaluation of the uterus, ovaries, adnexal regions, and pelvic cul-de-sac. Color and duplex Doppler ultrasound was utilized to evaluate blood flow to the ovaries.  COMPARISON:  Prior CT from 04/08/2015. FINDINGS: Uterus Measurements: 6.4 x 3.5 x 4.3 cm = volume: 51 mL. Uterus is anteflexed. 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 No discrete fibroid or other mass. Endometrium Thickness: 5.4 mm.  No focal abnormality visualized. Right ovary Measurements: 3.2 x 1.7 x 3.0 cm = volume: 8.5 mL. Normal appearance/no adnexal mass. Left ovary Measurements: 4.4 x 1.6 x 2.5 cm = volume: 9.0 mL. Normal appearance/no adnexal mass. Pulsed Doppler evaluation demonstrates normal low-resistance arterial and venous waveforms in both ovaries. Other: No free fluid within the pelvis. IMPRESSION: Normal pelvic ultrasound. No evidence for ovarian torsion or other acute abnormality. Electronically Signed   By: Rise Mu M.D.   On: 11/12/2020 03:38    ____________________________________________   PROCEDURES  Procedure(s) performed (including Critical Care):  Procedures   ____________________________________________   INITIAL IMPRESSION / ASSESSMENT AND PLAN / ED COURSE        Patient presents with [place] to history exam for assessment of several days of suprapubic and bilateral back pain associate with some vaginal spotting, abnormal vaginal discharge, burning with urination. Patient is afebrile hemodynamic stable arrival.  Urine does appear infected. However low suspicion for pyelonephritis given absence of CVA tenderness fever elevation white blood cell count. Urine culture was sent. In addition pelvic exam was concerning for possible PID. Patient given Rocephin to treat possible PID which also given ischial dose to cover for cystitis pathogens. No evidence on ultrasound of torsion or abscess. No focal tenderness on exam in the right lower quadrant or other findings clearly concerning for acute appendicitis. Low suspicion for other immediate life-threatening intra-abdominal pathology. Urine pregnancy test is negative. Patient has not anemic and CMP shows no significant  electrolyte or metabolic derangements. Lipase not consistent with pancreatitis. Patient did request to be tested for other STDs and these were sent including HIV, RPR, and hepatitis panel. GC chlamydia swab was obtained and pelvic exam. Rx written for doxycycline.  Wet prep unremarkable.  Rx written for doxycycline and patient did receive Rocephin in the ED.  Discharge stable condition.  Strict precautions advised discussed.  Instructed patient to follow-up with her PCP and return immediately to the emergency room should she experience any new or acute worsening of symptoms.  ____________________________________________   FINAL CLINICAL IMPRESSION(S) / ED DIAGNOSES  Final diagnoses:  Abdominal pain  Abnormal uterine bleeding  Acute cystitis with hematuria  PID (acute pelvic inflammatory disease)    Medications  cefTRIAXone (ROCEPHIN) 1 g in sodium chloride 0.9 % 100 mL IVPB (has no administration in time range)  ketorolac (TORADOL) 30 MG/ML injection 30 mg (has no administration in time range)  acetaminophen (TYLENOL) tablet 1,000 mg (1,000 mg Oral Given 11/12/20 0251)  ondansetron (ZOFRAN-ODT) disintegrating tablet 4 mg (4 mg Oral Given 11/12/20 0251)     ED Discharge Orders         Ordered    doxycycline (VIBRAMYCIN) 100 MG capsule  2 times daily        11/12/20 0349           Note:  This document was prepared using Dragon voice recognition software and may include unintentional dictation errors.   Gilles Chiquito, MD 11/12/20 0430

## 2020-11-13 LAB — URINE CULTURE

## 2020-11-13 LAB — RPR: RPR Ser Ql: NONREACTIVE

## 2020-12-18 ENCOUNTER — Other Ambulatory Visit: Payer: Self-pay

## 2020-12-18 ENCOUNTER — Emergency Department
Admission: EM | Admit: 2020-12-18 | Discharge: 2020-12-18 | Discharge status: Against Medical Advice | Payer: Medicaid Other | Attending: Emergency Medicine | Admitting: Emergency Medicine

## 2020-12-18 DIAGNOSIS — J069 Acute upper respiratory infection, unspecified: Secondary | ICD-10-CM | POA: Insufficient documentation

## 2020-12-18 DIAGNOSIS — F17210 Nicotine dependence, cigarettes, uncomplicated: Secondary | ICD-10-CM | POA: Insufficient documentation

## 2020-12-18 DIAGNOSIS — Z20822 Contact with and (suspected) exposure to covid-19: Secondary | ICD-10-CM | POA: Insufficient documentation

## 2020-12-18 LAB — RESP PANEL BY RT-PCR (FLU A&B, COVID) ARPGX2
Influenza A by PCR: NEGATIVE
Influenza B by PCR: NEGATIVE
SARS Coronavirus 2 by RT PCR: NEGATIVE

## 2020-12-18 LAB — POC SARS CORONAVIRUS 2 AG -  ED: SARS Coronavirus 2 Ag: NEGATIVE

## 2020-12-18 NOTE — ED Notes (Signed)
Pt stated she needs to leave. LWBS by provider

## 2020-12-18 NOTE — ED Provider Notes (Signed)
[facility] Emergency Department Provider Note  ____________________________________________   Event Date/Time   First MD Initiated Contact with Patient 12/18/20 1107     (approximate)  I have reviewed the triage vital signs and the nursing notes.   HISTORY  Chief Complaint Sore Throat (Generalized body aches)    HPI Susan Conrad is a 24 y.o. female presents emergency department complaining of headache,  body aches, sore throat, and chills for 2 to 3 days.  Patient is not vaccinated for Covid or influenza.  States that it started with a headache and has just gotten worse.  She denies vomiting or diarrhea.  No abdominal pain.   Past Medical History:  Diagnosis Date  . Migraine headache     Patient Active Problem List   Diagnosis Date Noted  . Smoker 1/2-3/4 ppd 06/01/2020  . Marijuana use daily 06/01/2020  . MENORRHAGIA 09/04/2010    Past Surgical History:  Procedure Laterality Date  . NO PAST SURGERIES      Prior to Admission medications   Medication Sig Start Date End Date Taking? Authorizing Provider  meloxicam (MOBIC) 15 MG tablet Take 1 tablet (15 mg total) by mouth daily. 08/18/20 08/18/21  Faythe Ghee, PA-C    Allergies Patient has no known allergies.  Family History  Problem Relation Age of Onset  . Thyroid disease Maternal Grandmother   . Diabetes Maternal Grandfather   . Heart disease Maternal Grandfather   . Hypertension Maternal Grandfather   . Stroke Maternal Grandfather   . Breast cancer Paternal Grandmother   . Hearing loss Neg Hx     Social History Social History   Tobacco Use  . Smoking status: Current Every Day Smoker    Packs/day: 0.25    Years: 1.00    Pack years: 0.25    Types: Cigarettes  . Smokeless tobacco: Never Used  Vaping Use  . Vaping Use: Never used  Substance Use Topics  . Alcohol use: No  . Drug use: No    Review of Systems  Constitutional: Positive fever/chills Eyes: No visual  changes. ENT: Positive sore throat. Respiratory: Positive cough Cardiovascular: Denies chest pain Gastrointestinal: Denies abdominal pain Genitourinary: Negative for dysuria. Musculoskeletal: Negative for back pain. Skin: Negative for rash. Psychiatric: no mood changes,     ____________________________________________   PHYSICAL EXAM:  VITAL SIGNS: ED Triage Vitals [12/18/20 1127]  Enc Vitals Group     BP 122/75     Pulse Rate 78     Resp 16     Temp 98.1 F (36.7 C)     Temp Source Oral     SpO2 100 %     Weight 115 lb (52.2 kg)     Height 5' (1.524 m)     Head Circumference      Peak Flow      Pain Score 10     Pain Loc      Pain Edu?      Excl. in GC?     Constitutional: Alert and oriented. Well appearing and in no acute distress. Eyes: Conjunctivae are normal.  Head: Atraumatic. Nose: No congestion/rhinnorhea. Mouth/Throat: Mucous membranes are moist.  Throat is mildly red Neck:  supple no lymphadenopathy noted Cardiovascular: Normal rate, regular rhythm. Heart sounds are normal Respiratory: Normal respiratory effort.  No retractions, lungs c t a  Abd: soft nontender bs normal all 4 quad GU: deferred Musculoskeletal: FROM all extremities, warm and well perfused Neurologic:  Normal speech and  language.  Skin:  Skin is warm, dry and intact. No rash noted. Psychiatric: Mood and affect are normal. Speech and behavior are normal.  ____________________________________________   LABS (all labs ordered are listed, but only abnormal results are displayed)  Labs Reviewed  POC SARS CORONAVIRUS 2 AG -  ED - Normal  RESP PANEL BY RT-PCR (FLU A&B, COVID) [id number]  GROUP A STREP BY PCR   ____________________________________________   ____________________________________________  RADIOLOGY    ____________________________________________   PROCEDURES  Procedure(s) performed: No  Procedures    ____________________________________________   INITIAL  IMPRESSION / ASSESSMENT AND PLAN / ED COURSE  Pertinent labs & imaging results that were available during my care of the patient were reviewed by me and considered in my medical decision making (see chart for details).   Patient is 24 year old female presents with Covid-like symptoms.  See HPI.  Physical exam shows patient to appear stable.  Antigen test for Covid is negative. PCR testing for Covid/influenza ordered.  Strep test ordered  Clinical Course as of 12/18/20 1517  Tue Dec 18, 2020  1516 Group A Strep by PCR [SF]    Clinical Course User Index [SF] Sherrie Mustache Roselyn Bering, PA-C  Patient eloped prior to testing results.  JOLI KOOB was evaluated in Emergency Department on 12/18/2020 for the symptoms described in the history of present illness. She was evaluated in the context of the global COVID-19 pandemic, which necessitated consideration that the patient might be at risk for infection with the SARS-CoV-2 virus that causes COVID-19. Institutional protocols and algorithms that pertain to the evaluation of patients at risk for COVID-19 are in a state of rapid change based on information released by regulatory bodies including the CDC and federal and state organizations. These policies and algorithms were followed during the patient's care in the ED.    As part of my medical decision making, I reviewed the following data within the electronic MEDICAL RECORD NUMBER Nursing notes reviewed and incorporated, Labs reviewed , Old chart reviewed, Notes from prior ED visits and [place] Controlled Substance Database  ____________________________________________   FINAL CLINICAL IMPRESSION(S) / ED DIAGNOSES  Final diagnoses:  Acute URI      NEW MEDICATIONS STARTED DURING THIS VISIT:  Discharge Medication List as of 12/18/2020  1:21 PM       Note:  This document was prepared using Dragon voice recognition software and may include unintentional dictation errors.    Faythe Ghee,  PA-C 12/18/20 1517    Jene Every, MD 12/18/20 [phone]

## 2020-12-20 ENCOUNTER — Telehealth: Payer: Self-pay | Admitting: Emergency Medicine

## 2020-12-20 NOTE — Telephone Encounter (Signed)
Called to inform of influenza result.  No answer and no voiceamil at either number.

## 2021-02-08 ENCOUNTER — Ambulatory Visit: Payer: Medicaid Other

## 2021-04-08 ENCOUNTER — Ambulatory Visit: Payer: Medicaid Other

## 2021-06-13 ENCOUNTER — Ambulatory Visit (LOCAL_COMMUNITY_HEALTH_CENTER): Payer: Medicaid Other | Admitting: Physician Assistant

## 2021-06-13 ENCOUNTER — Other Ambulatory Visit: Payer: Self-pay

## 2021-06-13 VITALS — BP 96/62 | Ht 60.0 in | Wt 107.4 lb

## 2021-06-13 DIAGNOSIS — Z01419 Encounter for gynecological examination (general) (routine) without abnormal findings: Secondary | ICD-10-CM | POA: Diagnosis not present

## 2021-06-13 DIAGNOSIS — Z3009 Encounter for other general counseling and advice on contraception: Secondary | ICD-10-CM

## 2021-06-13 DIAGNOSIS — Z3161 Procreative counseling and advice using natural family planning: Secondary | ICD-10-CM

## 2021-06-13 DIAGNOSIS — Z124 Encounter for screening for malignant neoplasm of cervix: Secondary | ICD-10-CM

## 2021-06-13 DIAGNOSIS — Z113 Encounter for screening for infections with a predominantly sexual mode of transmission: Secondary | ICD-10-CM

## 2021-06-13 LAB — WET PREP FOR TRICH, YEAST, CLUE
Trichomonas Exam: NEGATIVE
Yeast Exam: NEGATIVE

## 2021-06-13 NOTE — Progress Notes (Signed)
Wet mount reviewed, no tx per provider order. Pt knows she can RTC if she changes her mind about BC. Condoms declined. Desires pregnancy. Provider orders completed.

## 2021-06-13 NOTE — Progress Notes (Signed)
Pt states she is here for physical and STD screening. Declines all forms of birth control, desires pregnancy.

## 2021-06-15 ENCOUNTER — Encounter: Payer: Self-pay | Admitting: Physician Assistant

## 2021-06-15 NOTE — Progress Notes (Signed)
[facility] [address] Main Number: [phone]    Family Planning Visit- Initial Visit  Subjective:  Susan Conrad is a 25 y.o.  [id number]   being seen today for an initial annual visit and to discuss contraceptive options.  The patient is currently using Abstinence for pregnancy prevention. Patient reports she does want a pregnancy in the next year.  Patient has the following medical conditions has MENORRHAGIA; Smoker 1/2-3/4 ppd; and Marijuana use daily on their problem list.  Chief Complaint  Patient presents with   Contraception    Annual visit    Patient reports that she is here for a physical and to discuss achieving pregnancy.  States that she if not currently actively trying but would like information on planning a pregnancy.  Reports that she has noticed blood in her urine off and on and is not sure if it is vaginal spotting or actually in the urine.  Denies other urinary symptoms such as frequency, urgency, dysuria, hesitancy.  Reports that she had some sharp chest pains about 2 weeks ago but they have now resolved.  States that she has had some weight fluctuations that have to do with eating habits but wants to try to maintain a constant weight.  Per chart review, CBE and pap are due today.   Patient denies other concerns today.    Body mass index is 20.98 kg/m. - Patient is eligible for diabetes screening based on BMI and age >4?  not applicable HA1C ordered? not applicable  Patient reports 1  partner/s in last year. Desires STI screening?  Yes  Has patient been screened once for HCV in the past?  No   Lab Results  Component Value Date   HCVAB NON REACTIVE 11/12/2020    Does the patient have current drug use (including MJ), have a partner with drug use, and/or has been incarcerated since last result? No  If yes-- Screen for HCV through [facility]   Does the patient meet criteria for HBV testing?  No  Criteria:  -Household, sexual or needle sharing contact with HBV -History of drug use -HIV positive -Those with known Hep C   Health Maintenance Due  Topic Date Due   COVID-19 Vaccine (1) Never done   Pneumococcal Vaccine 8-71 Years old (1 - PCV) Never done   HPV VACCINES (1 - 2-dose series) Never done   TETANUS/TDAP  Never done   PAP-Cervical Cytology Screening  07/17/2019   PAP SMEAR-Modifier  07/17/2019    ROS  The following portions of the patient's history were reviewed and updated as appropriate: allergies, current medications, past family history, past medical history, past social history, past surgical history and problem list. Problem list updated.   See flowsheet for other program required questions.  Objective:   Vitals:   06/13/21 1127  BP: 96/62  Weight: 107 lb 6.4 oz (48.7 kg)  Height: 5' (1.524 m)    Physical Exam    Assessment and Plan:  Susan Conrad is a 25 y.o. female presenting to the [facility] Department for an initial annual wellness/contraceptive visit  Contraception counseling: Reviewed all forms of birth control options in the tiered based approach. available including abstinence; over the counter/barrier methods; hormonal contraceptive medication including pill, patch, ring, injection,contraceptive implant, ECP; hormonal and nonhormonal IUDs; permanent sterilization options including vasectomy and the various tubal sterilization modalities. Risks, benefits, and typical effectiveness rates were reviewed.  Questions were answered.  Written information was also given to the patient to review.  Patient desires to achieve pregnancy. She will follow up in  1 year and prn  for surveillance.  She was told to call with any further questions, or with any concerns about this method of contraception.  Emphasized use of condoms 100% of the time for STI prevention.  Patient was not a candidate for ECP today.   1. Encounter for counseling  regarding contraception Reviewed as above re: BCMs- risks, benefits and SE. Enc condoms with all sex for STD and pregnancy prevention until ready to try for a pregnancy. Enc to RTC if changes mind about achieving pregnancy to discuss hormonal  options.   2. Screening for STD (sexually transmitted disease) Await test results.  Counseled that RN will call if needs to RTC for treatment once results are back.  - WET PREP FOR TRICH, YEAST, CLUE - Chlamydia/Gonorrhea [place] [facility] - HIV/HCV [place] - Syphilis Serology, [place] [facility]  3. Well woman exam with routine gynecological exam Reviewed with patient healthy habits to maintain general health. Enc MVI 1 po daily. Enc patient to follow up with PCP for evaluation of chest pain if that starts again and urinary symptoms. Enc to establish with/ follow up with PCP for primary care concerns, age appropriate screenings and illness.   4. Routine Papanicolaou smear Await pap results.  Counseled that RN will call or send letter once results are back.  - IGP, rfx Aptima HPV ASCU  5. Counseling about natural family planning Natural Family Planning written info given to and reviewed with patient. Enc patient to RTC to further discuss this or call back with any questions.     Return in about 1 year (around 06/13/2022) for RP and prn.  No future appointments.  Matt Holmes, PA

## 2021-06-16 LAB — IGP, RFX APTIMA HPV ASCU: PAP Smear Comment: 0

## 2021-06-17 LAB — HM HIV SCREENING LAB: HM HIV Screening: NEGATIVE

## 2021-06-17 LAB — HM HEPATITIS C SCREENING LAB: HM Hepatitis Screen: NEGATIVE

## 2021-08-20 ENCOUNTER — Emergency Department
Admission: EM | Admit: 2021-08-20 | Discharge: 2021-08-20 | Disposition: A | Discharge status: Home / Self Care | Payer: Medicaid Other | Attending: Emergency Medicine | Admitting: Emergency Medicine

## 2021-08-20 ENCOUNTER — Encounter: Payer: Self-pay | Admitting: *Deleted

## 2021-08-20 ENCOUNTER — Other Ambulatory Visit: Payer: Self-pay

## 2021-08-20 DIAGNOSIS — K0889 Other specified disorders of teeth and supporting structures: Secondary | ICD-10-CM | POA: Insufficient documentation

## 2021-08-20 DIAGNOSIS — F17290 Nicotine dependence, other tobacco product, uncomplicated: Secondary | ICD-10-CM | POA: Insufficient documentation

## 2021-08-20 MED ORDER — KETOROLAC TROMETHAMINE 30 MG/ML IJ SOLN
30.0000 mg | Freq: Once | INTRAMUSCULAR | Status: AC
  Administered 2021-08-20: 30 mg via INTRAMUSCULAR
  Filled 2021-08-20: qty 1

## 2021-08-20 MED ORDER — AMOXICILLIN 875 MG PO TABS: 875.0000 mg | ORAL_TABLET | Freq: Two times a day (BID) | ORAL | 0 refills | Status: AC

## 2021-08-20 MED ORDER — AMOXICILLIN 500 MG PO CAPS
500.0000 mg | ORAL_CAPSULE | Freq: Once | ORAL | Status: AC
  Administered 2021-08-20: 500 mg via ORAL
  Filled 2021-08-20: qty 1

## 2021-08-20 MED ORDER — NAPROXEN 500 MG PO TBEC: 500.0000 mg | DELAYED_RELEASE_TABLET | Freq: Two times a day (BID) | ORAL | 0 refills | Status: AC

## 2021-08-20 NOTE — ED Notes (Signed)
Lower left toothache for the last week. Pt sts that she has attempted multiple different OTC med without relief.

## 2021-08-20 NOTE — ED Provider Notes (Signed)
[facility]-EMERGENCY DEPARTMENT  ____________________________________________  Time seen: Approximately 10:30 PM  I have reviewed the triage vital signs and the nursing notes.   HISTORY  Chief Complaint Dental Pain   Historian Patient     HPI Susan Conrad is a 25 y.o. female presents to the emergency department with nonspecific left lower dental pain for the past week.  Patient has not an appointment with a local dentist.  She has no pain underneath the tongue or difficulty swallowing.  No fever or chills.  No alleviating measures have been attempted.   Past Medical History:  Diagnosis Date   Migraine headache      Immunizations up to date:  Yes.     Past Medical History:  Diagnosis Date   Migraine headache     Patient Active Problem List   Diagnosis Date Noted   Smoker 1/2-3/4 ppd 06/01/2020   Marijuana use daily 06/01/2020   MENORRHAGIA 09/04/2010    Past Surgical History:  Procedure Laterality Date   NO PAST SURGERIES      Prior to Admission medications   Medication Sig Start Date End Date Taking? Authorizing Provider  amoxicillin (AMOXIL) 875 MG tablet Take 1 tablet (875 mg total) by mouth 2 (two) times daily for 10 days. 08/20/21 08/30/21 Yes Pia Mau M, PA-C  naproxen (EC NAPROSYN) 500 MG EC tablet Take 1 tablet (500 mg total) by mouth 2 (two) times daily with a meal for 10 days. 08/20/21 08/30/21 Yes Orvil Feil, PA-C    Allergies Patient has no known allergies.  Family History  Problem Relation Age of Onset   Breast cancer Paternal Grandmother    Thyroid disease Maternal Grandmother    Diabetes Maternal Grandfather    Heart disease Maternal Grandfather    Hypertension Maternal Grandfather    Stroke Maternal Grandfather    Heart disease Father    Asthma Mother    Asthma Sister    Hearing loss Neg Hx     Social History Social History   Tobacco Use   Smoking status: Former    Types: E-cigarettes   Smokeless tobacco: Never  Water quality scientist Use: Every day  Substance Use Topics   Alcohol use: Not Currently    Comment: "every now and then"   Drug use: Never     Review of Systems  Constitutional: No fever/chills Eyes:  No discharge ENT: Patient has dental pain.  Respiratory: no cough. No SOB/ use of accessory muscles to breath Gastrointestinal:   No nausea, no vomiting.  No diarrhea.  No constipation. Musculoskeletal: Negative for musculoskeletal pain. Skin: Negative for rash, abrasions, lacerations, ecchymosis.    ____________________________________________   PHYSICAL EXAM:  VITAL SIGNS: ED Triage Vitals [08/20/21 2046]  Enc Vitals Group     BP (!) 151/133     Pulse Rate 73     Resp 18     Temp 98.9 F (37.2 C)     Temp Source Oral     SpO2 100 %     Weight 115 lb (52.2 kg)     Height 5' (1.524 m)     Head Circumference      Peak Flow      Pain Score 10     Pain Loc      Pain Edu?      Excl. in GC?      Constitutional: Alert and oriented. Well appearing and in no acute distress. Eyes: Conjunctivae are normal. PERRL. EOMI. Head: Atraumatic. ENT:  Ears: Tms are pearly.       Nose: No congestion/rhinnorhea.      Mouth/Throat: Mucous membranes are moist.  Patient has mild swelling of the left lower jaw.  No significant dental caries. Neck: No stridor.  No cervical spine tenderness to palpation. Cardiovascular: Normal rate, regular rhythm. Normal S1 and S2.  Good peripheral circulation. Respiratory: Normal respiratory effort without tachypnea or retractions. Lungs CTAB. Good air entry to the bases with no decreased or absent breath sounds Musculoskeletal: Full range of motion to all extremities. No obvious deformities noted Neurologic:  Normal for age. No gross focal neurologic deficits are appreciated.  Skin:  Skin is warm, dry and intact. No rash noted. Psychiatric: Mood and affect are normal for age. Speech and behavior are normal.    ____________________________________________   LABS (all labs ordered are listed, but only abnormal results are displayed)  Labs Reviewed - No data to display ____________________________________________  EKG   ____________________________________________  RADIOLOGY   No results found.  ____________________________________________    PROCEDURES  Procedure(s) performed:     Procedures     Medications  amoxicillin (AMOXIL) capsule 500 mg (500 mg Oral Given 08/20/21 2217)  ketorolac (TORADOL) 30 MG/ML injection 30 mg (30 mg Intramuscular Given 08/20/21 2212)     ____________________________________________   INITIAL IMPRESSION / ASSESSMENT AND PLAN / ED COURSE  Pertinent labs & imaging results that were available during my care of the patient were reviewed by me and considered in my medical decision making (see chart for details).      Assessment and plan Dental pain 25 year old female presents to the emergency department with left lower dental pain for the past week.  Patient was hypertensive at triage but vital signs were otherwise reassuring.  Patient was started on amoxicillin and was given an injection of Toradol.  She was discharged with naproxen.  Resources for local dentist were provided in her discharge paperwork.     ____________________________________________  FINAL CLINICAL IMPRESSION(S) / ED DIAGNOSES  Final diagnoses:  Pain, dental      NEW MEDICATIONS STARTED DURING THIS VISIT:  ED Discharge Orders          Ordered    amoxicillin (AMOXIL) 875 MG tablet  2 times daily        08/20/21 2147    naproxen (EC NAPROSYN) 500 MG EC tablet  2 times daily with meals        08/20/21 2147                This chart was dictated using voice recognition software/Dragon. Despite best efforts to proofread, errors can occur which can change the meaning. Any change was purely unintentional.     Orvil Feil, PA-C 08/20/21  2233    Delton Prairie, MD 08/23/21 1524

## 2021-08-20 NOTE — ED Triage Notes (Signed)
Pt has left lower toothache for 1 week.  Pt taking motrin without relief.  Pt alert  speech clear.

## 2021-08-20 NOTE — Discharge Instructions (Addendum)
OPTIONS FOR DENTAL FOLLOW UP CARE ° °[place] [facility] - Local Safety Net Dental Clinics °http://www.ncdhhs.gov/dph/oralhealth/services/safetynetclinics.htm °  °[facility] (336-562-3123) ° °[facility] [place] (919-933-9087) ° °[facility] ([phone] ext [phone]) ° °[facility] ([phone]) ° °[facility] ([phone]) °This clinic caters to the indigent population and is on a lottery system. °Location: °[facility], [address], [place] °Clinic Hours: °Wednesdays from 6pm - 9pm, patients seen by a lottery system. °For dates, call or go to [email or web address].edu/[facility]/patients/[email or web address] °Services: °Cleanings, fillings and simple extractions. °Payment Options: °DENTAL WORK IS FREE OF CHARGE. Bring proof of income or support. °Best way to get seen: °Arrive at 5:15 pm - this is a lottery, NOT first come/first serve, so arriving earlier will not increase your chances of being seen. °  °  °[facility] °[phone] °Select option 1 for emergencies °  °Location: °[facility], [address], [place] °Clinic Hours: °No walk-ins accepted - call the day before to schedule an appointment. °Check in times are 9:30 am and 1:30 pm. °Services: °Simple extractions, temporary fillings, pulpectomy/pulp debridement, uncomplicated abscess drainage. °Payment Options: °PAYMENT IS DUE AT THE TIME OF SERVICE.  Fee is usually $100-200, additional surgical procedures (e.g. abscess drainage) may be extra. °Cash, checks, Visa/MasterCard accepted.  Can file Medicaid if patient is covered for dental - patient should call case worker to check. °No discount for [facility] patients. °Best way to get seen: °MUST call the day before and get onto the schedule. Can usually be seen the next 1-2 days. No walk-ins accepted. °  °  °[facility] °[phone] °   °Location: °[facility], [address], [place] °Clinic Hours: °M, W, Th, F 8am or 1:30pm, Tues 9a or 1:30 - first come/first served. °Services: °Simple extractions, temporary fillings, uncomplicated abscess drainage.  You do not need to be an [place] resident. °Payment Options: °PAYMENT IS DUE AT THE TIME OF SERVICE. °Dental insurance, otherwise sliding scale - bring proof of income or support. °Depending on income and treatment needed, cost is usually $50-200. °Best way to get seen: °Arrive early as it is first come/first served. °  °  °[facility] °[phone] °  °Location: °[address] °Clinic Hours: °Mon-Thu 8a-5p °Services: °Most basic dental services including extractions and fillings. °Payment Options: °PAYMENT IS DUE AT THE TIME OF SERVICE. °Sliding scale, up to 50% off - bring proof if income or support. °Medicaid with dental option accepted. °Best way to get seen: °Call to schedule an appointment, can usually be seen within 2 weeks OR they will try to see walk-ins - show up at 8a or 2p (you may have to wait). °  °  °[facility] °919-245-2435 °[place] RESIDENTS ONLY °  °Location: °[facility], [address], [place], [place] [address] °Clinic Hours: By appointment only. °Monday - Thursday 8am-5pm, Friday 8am-12pm °Services: Cleanings, fillings, extractions. °Payment Options: °PAYMENT IS DUE AT THE TIME OF SERVICE. °Cash, Visa or MasterCard. Sliding scale - $30 minimum per service. °Best way to get seen: °Come in to office, complete packet and make an appointment - need proof of income °or support monies for each household member and proof of [place] residence. °Usually takes about a month to get in. °  °  °[facility] °[phone] °  °Location: °[address]   [place] °Clinic Hours: Walk-in Urgent Care Dental Services are offered Monday-Friday  mornings only. °The numbers of emergencies accepted daily is limited to the number of °providers available. °Maximum 15 - Mondays, Wednesdays & Thursdays °Maximum 10 - Tuesdays & Fridays °Services: °You do not need to be a [place] resident to be seen for a dental emergency. °Emergencies are defined as pain, swelling, abnormal bleeding, or dental trauma. Walkins will receive x-rays if needed. °NOTE: Dental cleaning is not an emergency. °Payment Options: °PAYMENT IS DUE AT THE TIME OF SERVICE. °Minimum co-pay is $40.00 for uninsured patients. °Minimum co-pay is $3.00 for Medicaid with dental coverage. °Dental Insurance is accepted and must be presented at time of visit. °Medicare does not cover dental. °Forms of payment: Cash, credit card, checks. °Best way to get seen: °If not previously registered with the clinic, walk-in dental registration begins at 7:15 am and is on a first come/first serve basis. °If previously registered with the clinic, call to make an appointment. °  °  °[facility] °[phone] °[place] RESIDENTS ONLY °  °Location: °[address], [place], [place] °Clinic Hours: °Mon-Thu 10a-2p °Services: Extractions only! °Payment Options: °FREE (donations accepted) - bring proof of income or support °Best way to get seen: °Call and schedule an appointment OR come at 8am on the 1st Monday of every month (except for holidays) when it is first come/first served. °  °  °[facility] °[phone] °  °Location: °[address], [place] °Clinic Hours: °Friday mornings °Services, Payment Options, Best way to get seen: °Call for info °

## 2021-10-16 IMAGING — DX DG KNEE COMPLETE 4+V*L*
4 series · 4 of 4 positions shown · non-contrast
Comparison: None.

CLINICAL DATA: Pt arrived via POV with reports of left knee pain
after twisting it when someone fell on her last night. Pt states the
pain is worse today than yesterday. Pt reports swelling that has
improved. Pt has not taken anything for pain.

EXAM:
LEFT KNEE - COMPLETE 4+ VIEW

[knee obl (1 of 2)]
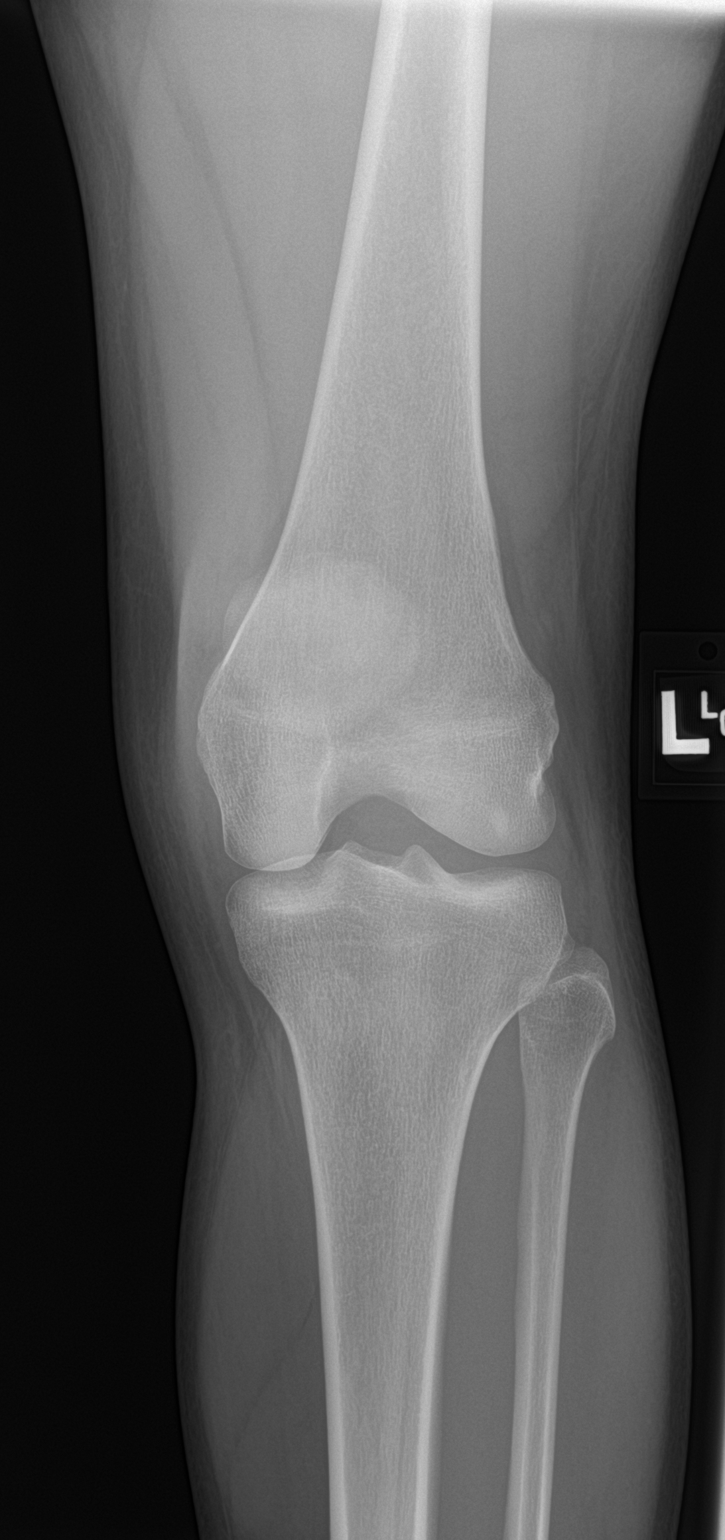

[knee ap]
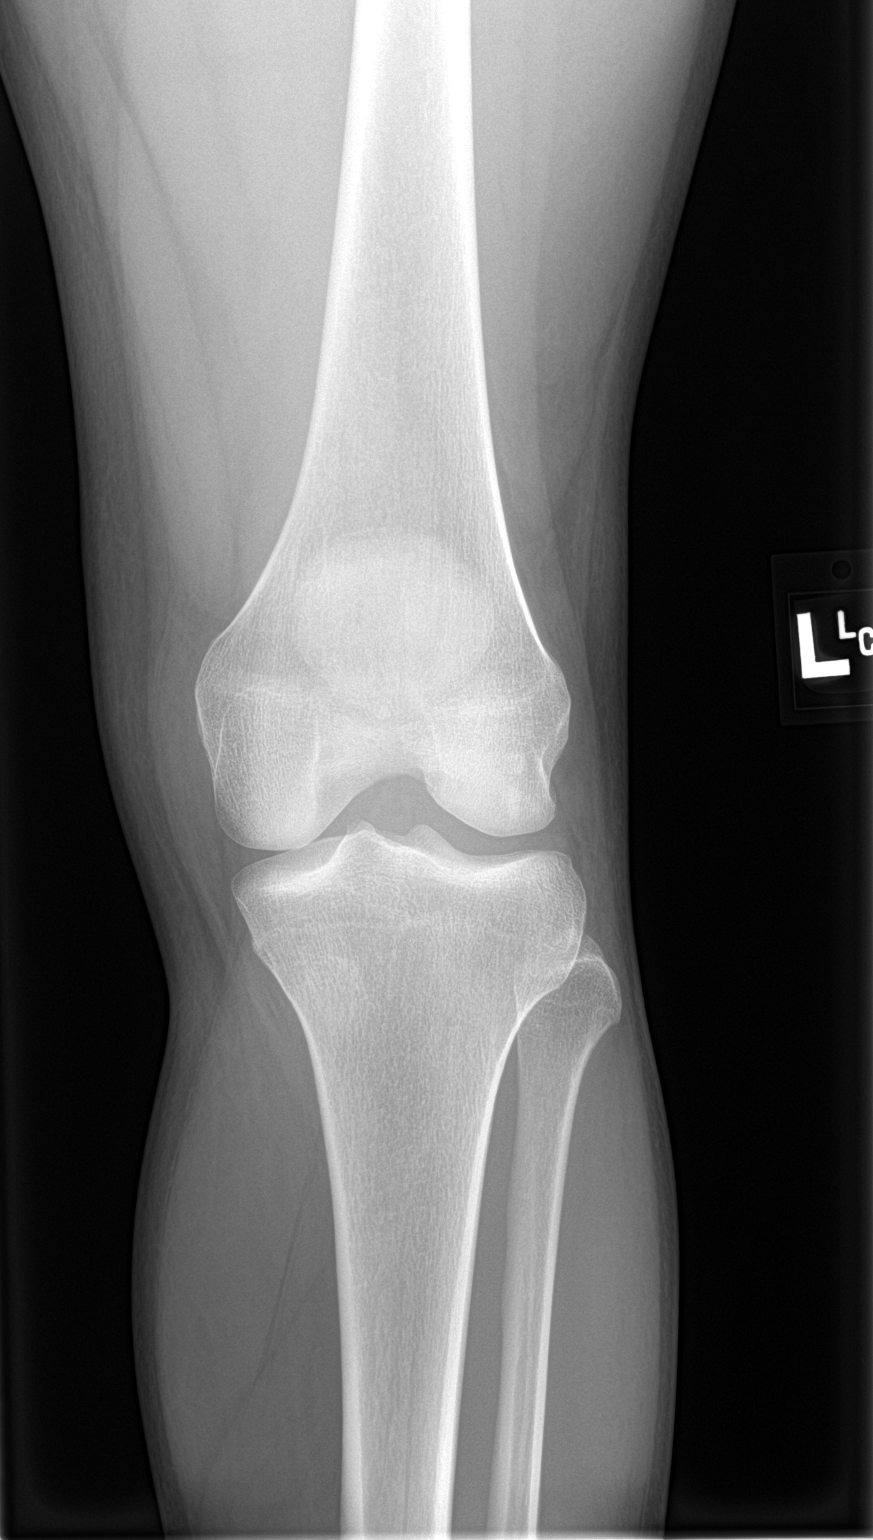

[knee obl (2 of 2)]
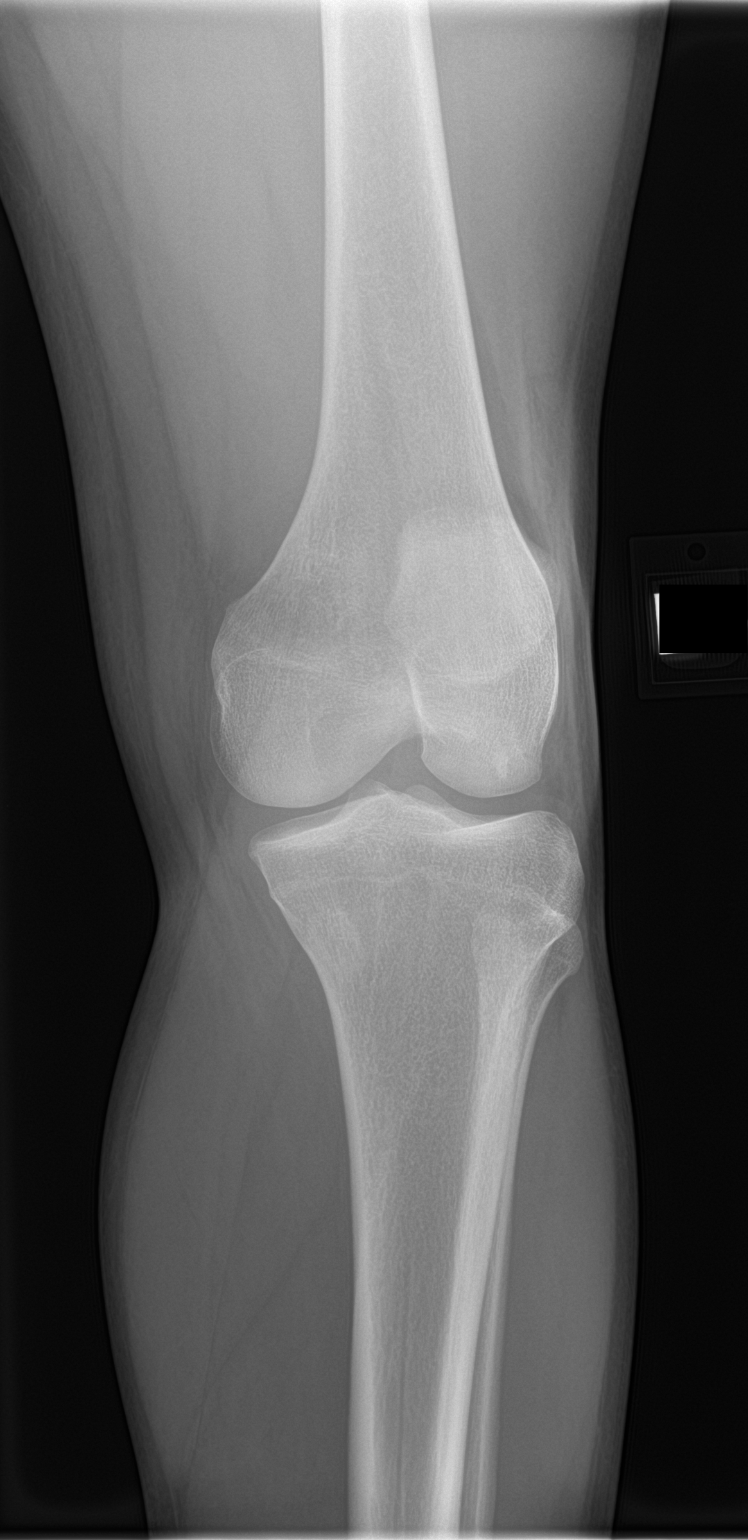

[knee lat]
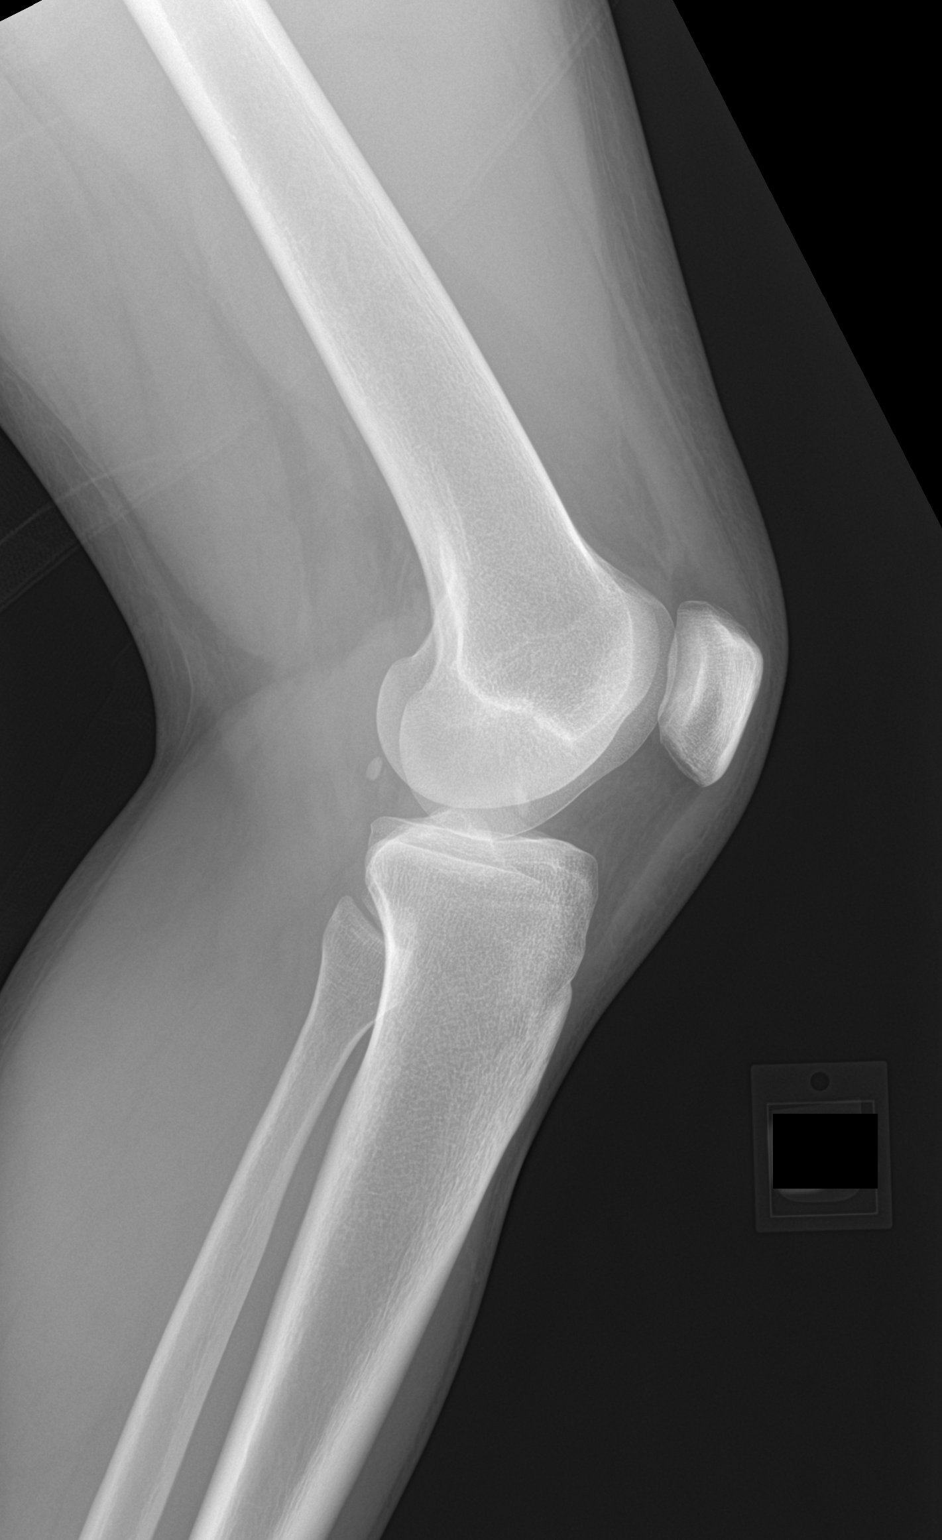

[4 of 4 positions shown; findings below may reference images not displayed]

FINDINGS: No evidence of fracture, dislocation, or joint effusion. No evidence
of arthropathy or other focal bone abnormality. Soft tissues are
unremarkable.
IMPRESSION: Negative.

## 2022-01-02 ENCOUNTER — Ambulatory Visit: Payer: Medicaid Other

## 2022-01-10 IMAGING — US US ART/VEN ABD/PELV/SCROTUM DOPPLER LTD
1 series · 14 of 25 positions shown · non-contrast
Comparison: Prior CT from 04/08/2015.

CLINICAL DATA: Initial evaluation for acute lower pelvic pain,
cramping.

EXAM:
TRANSABDOMINAL ULTRASOUND OF PELVIS
DOPPLER ULTRASOUND OF OVARIES
TECHNIQUE: Transabdominal ultrasound examination of the pelvis was performed
including evaluation of the uterus, ovaries, adnexal regions, and
pelvic cul-de-sac.
Color and duplex Doppler ultrasound was utilized to evaluate blood
flow to the ovaries.

[Series 1: us pelvic complete w transvaginal and torsion righ · 14 of 46 slices shown]
[im 1/46]
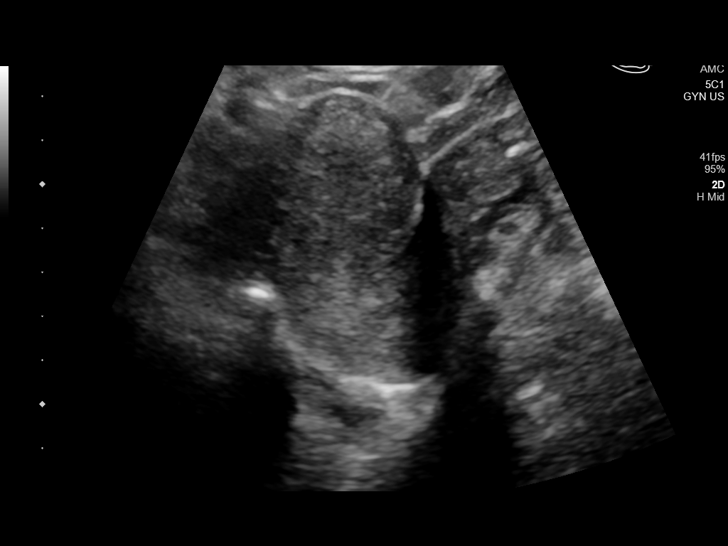
[im 4/46]
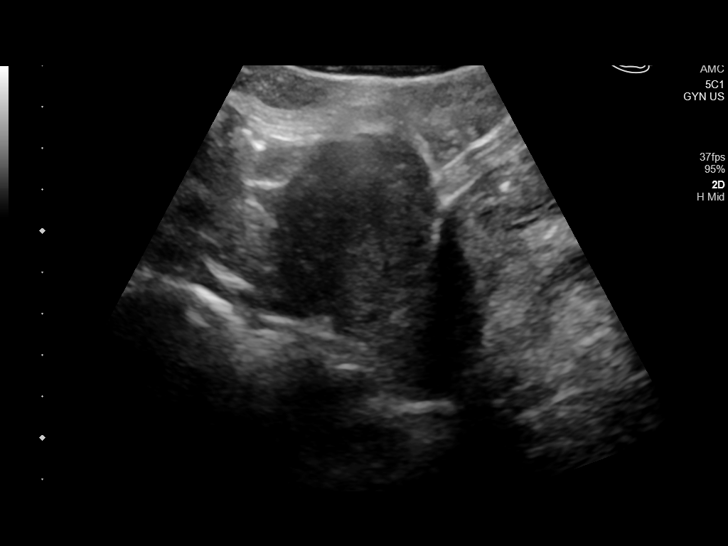
[im 8/46]
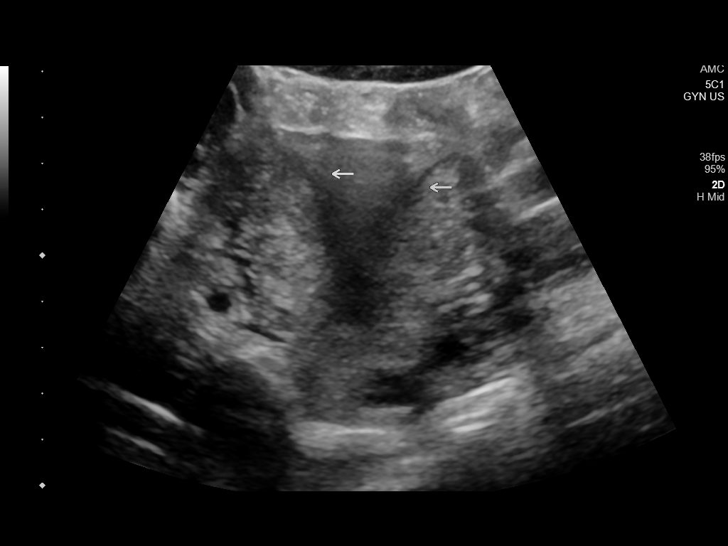
[im 12/46]
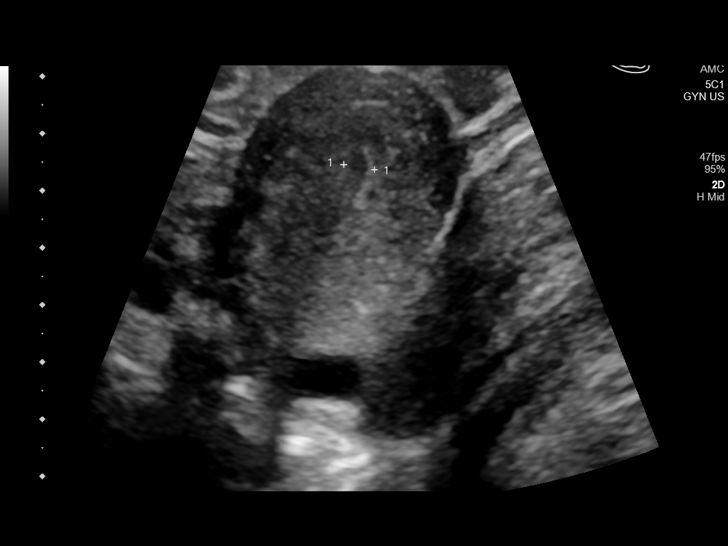
[im 16/46]
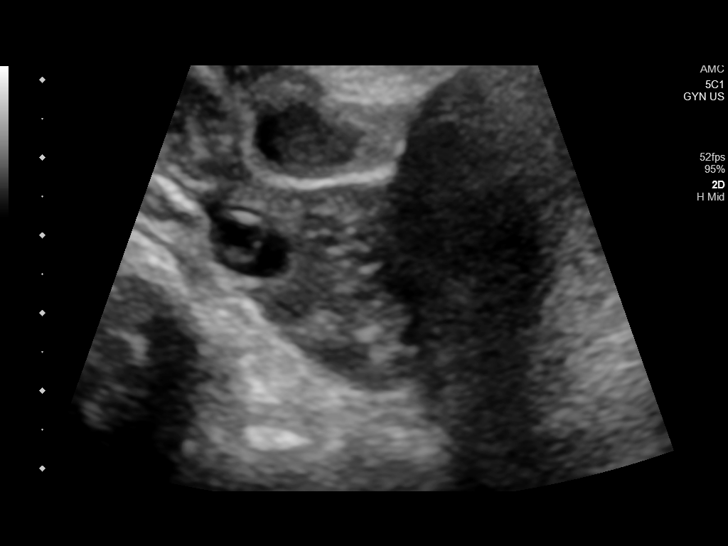
[im 17/46]
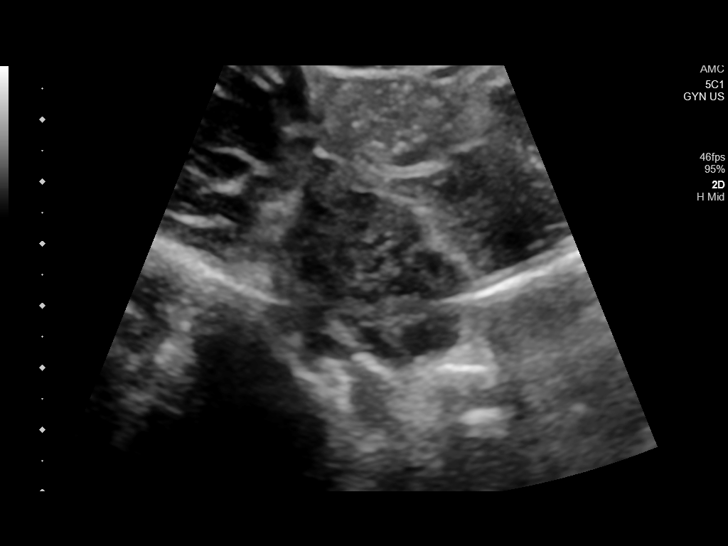
[im 21/46]
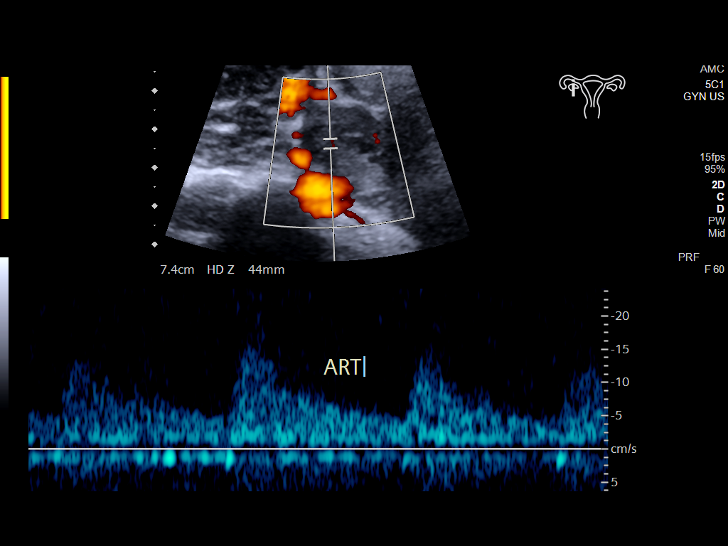
[im 25/46]
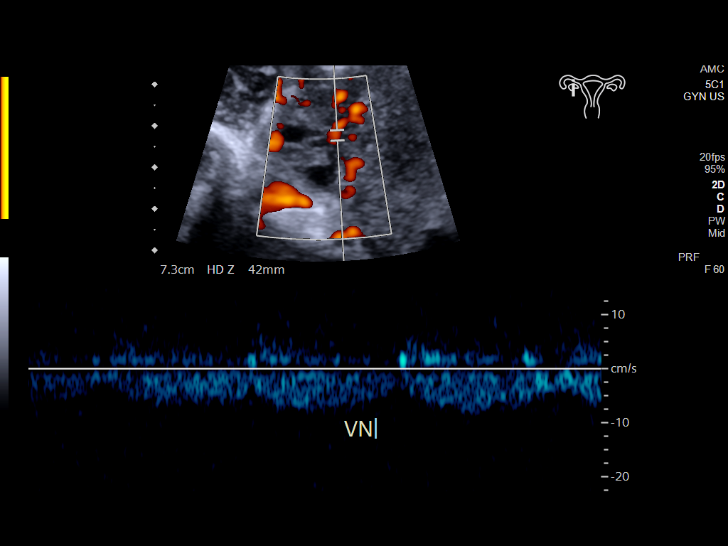
[im 29/46]
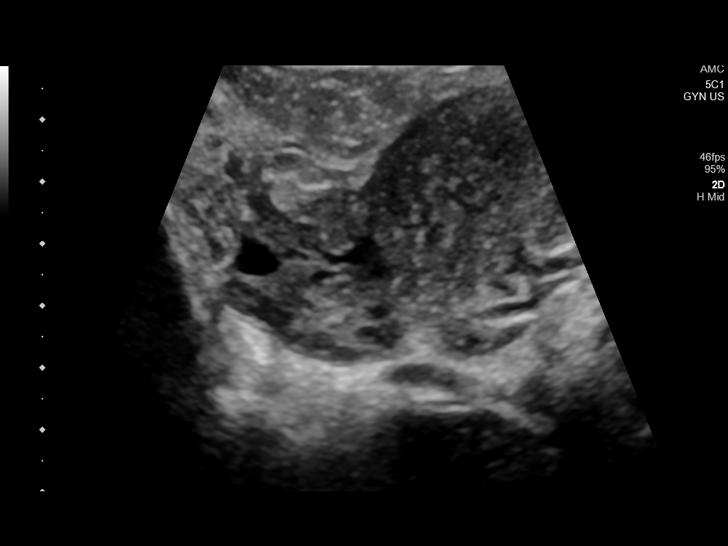
[im 31/46]
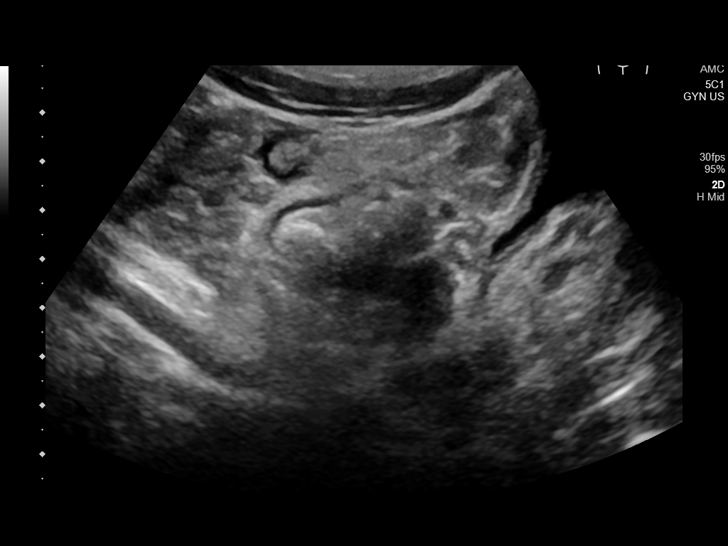
[im 34/46]
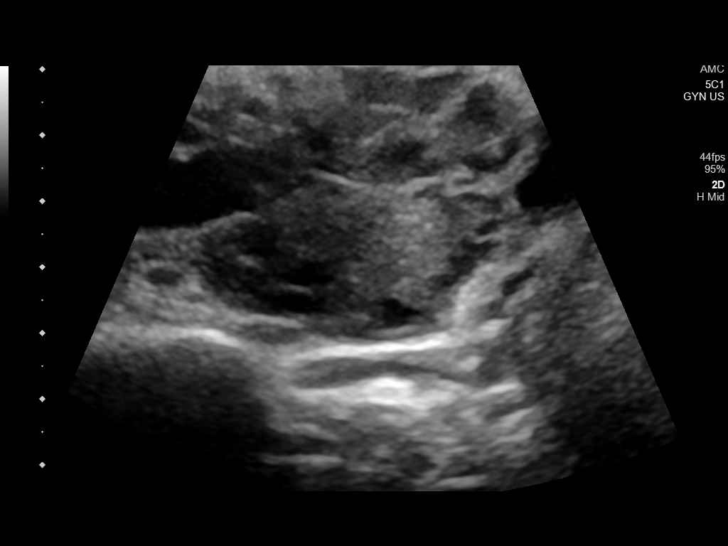
[im 38/46]
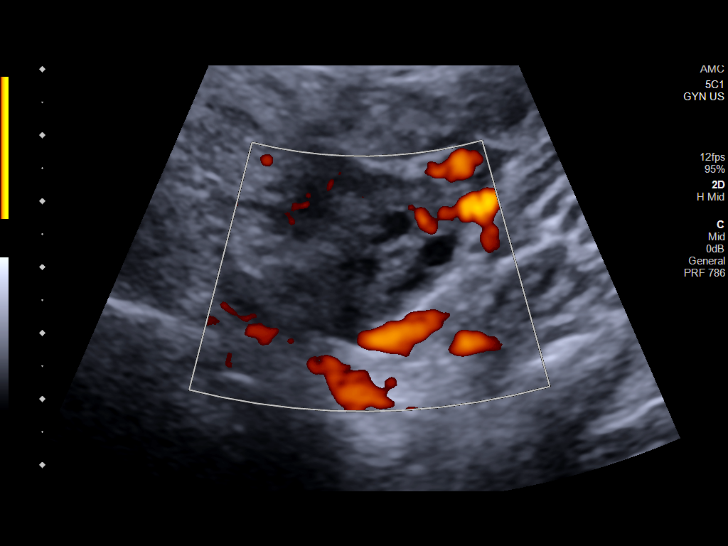
[im 42/46]
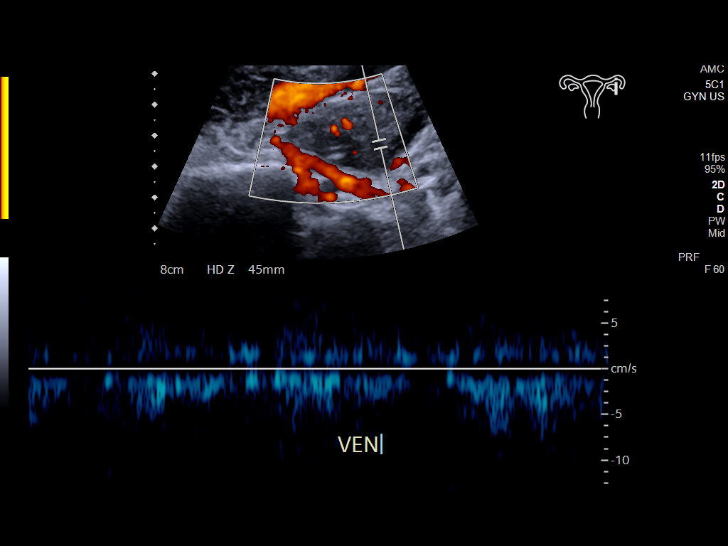
[im 46/46]
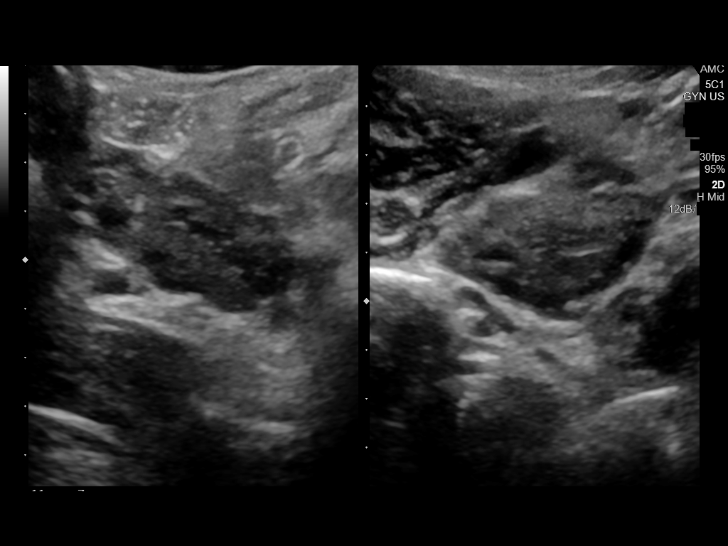

[14 of 25 positions shown; findings below may reference images not displayed]

FINDINGS: Uterus

Measurements: 6.4 x 3.5 x 4.3 cm = volume: 51 mL. Uterus is
anteflexed. Possible arcuate uterus noted. No discrete fibroid or
other mass.

Endometrium

Thickness: 5.4 mm.  No focal abnormality visualized.

Right ovary

Measurements: 3.2 x 1.7 x 3.0 cm = volume: 8.5 mL. Normal
appearance/no adnexal mass.

Left ovary

Measurements: 4.4 x 1.6 x 2.5 cm = volume: 9.0 mL. Normal
appearance/no adnexal mass.

Pulsed Doppler evaluation demonstrates normal low-resistance
arterial and venous waveforms in both ovaries.

Other: No free fluid within the pelvis.
IMPRESSION: Normal pelvic ultrasound. No evidence for ovarian torsion or other
acute abnormality.

## 2022-04-07 ENCOUNTER — Ambulatory Visit: Payer: Medicaid Other

## 2022-07-08 ENCOUNTER — Ambulatory Visit (INDEPENDENT_AMBULATORY_CARE_PROVIDER_SITE_OTHER)
Admission: RE | Admit: 2022-07-08 | Discharge: 2022-07-08 | Disposition: A | Discharge status: Home / Self Care | Payer: Medicaid Other | Source: Ambulatory Visit | Attending: Family | Admitting: Family

## 2022-07-08 ENCOUNTER — Ambulatory Visit (INDEPENDENT_AMBULATORY_CARE_PROVIDER_SITE_OTHER): Payer: Self-pay | Admitting: Family

## 2022-07-08 ENCOUNTER — Other Ambulatory Visit (HOSPITAL_COMMUNITY)
Admission: RE | Admit: 2022-07-08 | Discharge: 2022-07-08 | Disposition: A | Discharge status: Home / Self Care | Payer: Medicaid Other | Source: Ambulatory Visit | Attending: Family | Admitting: Family

## 2022-07-08 ENCOUNTER — Encounter: Payer: Self-pay | Admitting: Family

## 2022-07-08 VITALS — BP 108/70 | HR 85 | Temp 98.6°F | Resp 16 | Ht 60.0 in | Wt 106.5 lb

## 2022-07-08 DIAGNOSIS — Z111 Encounter for screening for respiratory tuberculosis: Secondary | ICD-10-CM | POA: Insufficient documentation

## 2022-07-08 DIAGNOSIS — R591 Generalized enlarged lymph nodes: Secondary | ICD-10-CM

## 2022-07-08 DIAGNOSIS — B370 Candidal stomatitis: Secondary | ICD-10-CM

## 2022-07-08 DIAGNOSIS — Z113 Encounter for screening for infections with a predominantly sexual mode of transmission: Secondary | ICD-10-CM | POA: Diagnosis present

## 2022-07-08 DIAGNOSIS — F418 Other specified anxiety disorders: Secondary | ICD-10-CM

## 2022-07-08 HISTORY — DX: Other specified anxiety disorders: F41.8

## 2022-07-08 LAB — CBC WITH DIFFERENTIAL/PLATELET
Basophils Absolute: 0.1 10*3/uL (ref 0.0–0.1)
Basophils Relative: 0.8 % (ref 0.0–3.0)
Eosinophils Absolute: 0.1 10*3/uL (ref 0.0–0.7)
Eosinophils Relative: 0.8 % (ref 0.0–5.0)
HCT: 39.3 % (ref 36.0–46.0)
Hemoglobin: 12.9 g/dL (ref 12.0–15.0)
Lymphocytes Relative: 24.7 % (ref 12.0–46.0)
Lymphs Abs: 1.9 10*3/uL (ref 0.7–4.0)
MCHC: 32.8 g/dL (ref 30.0–36.0)
MCV: 89.1 fl (ref 78.0–100.0)
Monocytes Absolute: 0.4 10*3/uL (ref 0.1–1.0)
Monocytes Relative: 4.6 % (ref 3.0–12.0)
Neutro Abs: 5.2 10*3/uL (ref 1.4–7.7)
Neutrophils Relative %: 69.1 % (ref 43.0–77.0)
Platelets: 386 10*3/uL (ref 150.0–400.0)
RBC: 4.41 Mil/uL (ref 3.87–5.11)
RDW: 15.2 % (ref 11.5–15.5)
WBC: 7.6 10*3/uL (ref 4.0–10.5)

## 2022-07-08 LAB — COMPREHENSIVE METABOLIC PANEL
ALT: 14 U/L (ref 0–35)
AST: 16 U/L (ref 0–37)
Albumin: 4.8 g/dL (ref 3.5–5.2)
Alkaline Phosphatase: 59 U/L (ref 39–117)
BUN: 11 mg/dL (ref 6–23)
CO2: 30 mEq/L (ref 19–32)
Calcium: 9.9 mg/dL (ref 8.4–10.5)
Chloride: 100 mEq/L (ref 96–112)
Creatinine, Ser: 0.83 mg/dL (ref 0.40–1.20)
GFR: 96.67 mL/min (ref >60.00)
Glucose, Bld: 84 mg/dL (ref 70–99)
Potassium: 4 mEq/L (ref 3.5–5.1)
Sodium: 136 mEq/L (ref 135–145)
Total Bilirubin: 0.2 mg/dL (ref 0.2–1.2)
Total Protein: 8 g/dL (ref 6.0–8.3)

## 2022-07-08 MED ORDER — ESCITALOPRAM OXALATE 10 MG PO TABS: 10.0000 mg | ORAL_TABLET | Freq: Every day | ORAL | 1 refills | Status: DC

## 2022-07-08 NOTE — Patient Instructions (Addendum)
Start exapro 10 mg for anxiety and depression. Take 1/2 tablet by mouth once daily for about one week, then increase to 1 full tablet thereafter.   Taking the medicine as directed and not missing any doses is one of the best things you can do to treat your anxiety/depression.  Here are some things to keep in mind:  Side effects (stomach upset, some increased anxiety) may happen before you notice a benefit.  These side effects typically go away over time. Changes to your dose of medicine or a change in medication all together is sometimes necessary Many people will notice an improvement within two weeks but the full effect of the medication can take up to 4-6 weeks Stopping the medication when you start feeling better often results in a return of symptoms. Most people need to be on medication at least 6-12 months If you start having thoughts of hurting yourself or others after starting this medicine, please call me immediately.     Due to recent changes in healthcare laws, you may see results of your imaging and/or laboratory studies on MyChart before I have had a chance to review them.  I understand that in some cases there may be results that are confusing or concerning to you. Please understand that not all results are received at the same time and often I may need to interpret multiple results in order to provide you with the best plan of care or course of treatment. Therefore, I ask that you please give me 2 business days to thoroughly review all your results before contacting my office for clarification. Should we see a critical lab result, you will be contacted sooner.   It was a pleasure seeing you today! Please do not hesitate to reach out with any questions and or concerns.  Regards,   Mort Sawyers FNP-C

## 2022-07-08 NOTE — Assessment & Plan Note (Signed)
R/o other ddx pending results of hiv cbc cmp

## 2022-07-08 NOTE — Assessment & Plan Note (Signed)
I instructed pt to start lexapro 10 mg 1/2 tablet once daily for 1 week and then increase to a full tablet once daily on week two as tolerated.  We discussed common side effects such as nausea, drowsiness and weight gain.  Also discussed rare but serious side effect of suicidal ideation.  She is instructed to discontinue medication and go directly to ED if this occurs.  Pt verbalizes understanding.  Plan is to follow up in 30 days to evaluate progress.

## 2022-07-08 NOTE — Assessment & Plan Note (Signed)
Safe sex d/w pt .  Std panel ordered today. Pending results.   

## 2022-07-08 NOTE — Progress Notes (Signed)
Established Patient Office Visit  Subjective:  Patient ID: Susan Conrad, female    DOB: 23-Oct-1996  Age: 26 y.o. MRN: [id number]  CC:  Chief Complaint  Patient presents with   Establish Care    HPI Susan Conrad is here today with concerns.   Pt concerned, seeing bumps on her tongue started about 2-3 days ago. Painful at times. Also with slight vaginal discharge, white milky discharge.  She does have vaginal odor as well  , fishy  Lower suprapubic tenderness.  Does notice lymph node swelling on right lower pelvic area, this has been going on for months. Has been seen for this before with similar concerns.   Has been with boyfriend of 11 years, and she knows he cheated on her so she wants to be fully tested.   Also with ongoing weight loss , not trying to lose weight.  Wt Readings from Last 3 Encounters:  07/08/22 106 lb 8 oz (48.3 kg)  08/20/21 115 lb (52.2 kg)  06/13/21 107 lb 6.4 oz (48.7 kg)    She had trich in the past but has been treated.   Past Medical History:  Diagnosis Date   Migraine headache     Past Surgical History:  Procedure Laterality Date   NO PAST SURGERIES      Family History  Problem Relation Age of Onset   Asthma Mother    Heart disease Father    Asthma Sister    Thyroid disease Maternal Grandmother    Diabetes Maternal Grandfather    Heart disease Maternal Grandfather    Hypertension Maternal Grandfather    Stroke Maternal Grandfather    Breast cancer Paternal Grandmother    Hearing loss Neg Hx     Social History   Socioeconomic History   Marital status: Single    Spouse name: Not on file   Number of children: Not on file   Years of education: Not on file   Highest education level: Not on file  Occupational History   Not on file  Tobacco Use   Smoking status: Every Day    Packs/day: 0.25    Years: 11.00    Total pack years: 2.75    Types: E-cigarettes, Cigarettes   Smokeless tobacco: Never  Vaping Use   Vaping Use:  Every day  Substance and Sexual Activity   Alcohol use: Yes    Comment: "every now and then"   Drug use: Never   Sexual activity: Yes    Birth control/protection: None  Other Topics Concern   Not on file  Social History Narrative   Not on file   Social Determinants of Health   Financial Resource Strain: Not on file  Food Insecurity: Not on file  Transportation Needs: Not on file  Physical Activity: Not on file  Stress: Not on file  Social Connections: Not on file  Intimate Partner Violence: Not At Risk (06/13/2021)   Humiliation, Afraid, Rape, and Kick questionnaire    Fear of Current or Ex-Partner: No    Emotionally Abused: No    Physically Abused: No    Sexually Abused: No    No outpatient medications prior to visit.   No facility-administered medications prior to visit.    No Known Allergies      Objective:    Physical Exam Vitals reviewed.  Constitutional:      General: She is not in acute distress.    Appearance: Normal appearance. She is underweight. She is not ill-appearing  or toxic-appearing.  HENT:     Right Ear: Tympanic membrane normal.     Left Ear: Tympanic membrane normal.     Mouth/Throat:     Mouth: Mucous membranes are moist.     Dentition: Abnormal dentition (with plaquing surrounding teeth poor dental health).     Tongue: Lesions (white budding with base of erythema) present.     Pharynx: No pharyngeal swelling.     Tonsils: No tonsillar exudate.  Eyes:     Extraocular Movements: Extraocular movements intact.     Conjunctiva/sclera: Conjunctivae normal.     Pupils: Pupils are equal, round, and reactive to light.  Neck:     Thyroid: No thyroid mass.  Cardiovascular:     Rate and Rhythm: Normal rate and regular rhythm.  Pulmonary:     Effort: Pulmonary effort is normal.     Breath sounds: Normal breath sounds.  Abdominal:     General: Abdomen is flat. Bowel sounds are normal.     Palpations: Abdomen is soft.     Tenderness: There is  abdominal tenderness (suprapubic tenderness).  Musculoskeletal:        General: Normal range of motion.  Lymphadenopathy:     Head:     Right side of head: Submental and submandibular adenopathy present.     Left side of head: Submental and submandibular adenopathy present.     Cervical: Cervical adenopathy present.     Right cervical: Superficial cervical adenopathy present.     Left cervical: Superficial cervical adenopathy present.     Upper Body:     Right upper body: Axillary adenopathy present.     Left upper body: Axillary adenopathy present.     Lower Body: Right inguinal adenopathy present. Left inguinal adenopathy present.  Skin:    General: Skin is warm.     Capillary Refill: Capillary refill takes less than 2 seconds.  Neurological:     General: No focal deficit present.     Mental Status: She is alert and oriented to person, place, and time.  Psychiatric:        Mood and Affect: Mood normal. Affect is tearful.        Behavior: Behavior normal.        Thought Content: Thought content normal.        Judgment: Judgment normal.     BP 108/70   Pulse 85   Temp 98.6 F (37 C)   Resp 16   Ht 5' (1.524 m)   Wt 106 lb 8 oz (48.3 kg)   LMP 06/25/2022 (Approximate)   SpO2 99%   BMI 20.80 kg/m  Wt Readings from Last 3 Encounters:  07/08/22 106 lb 8 oz (48.3 kg)  08/20/21 115 lb (52.2 kg)  06/13/21 107 lb 6.4 oz (48.7 kg)     Health Maintenance Due  Topic Date Due   COVID-19 Vaccine (1) Never done   TETANUS/TDAP  07/19/2018   PAP-Cervical Cytology Screening  07/17/2019    There are no preventive care reminders to display for this patient.  No results found for: "TSH" Lab Results  Component Value Date   WBC 9.1 11/12/2020   HGB 13.7 11/12/2020   HCT 41.4 11/12/2020   MCV 89.4 11/12/2020   PLT 369 11/12/2020   Lab Results  Component Value Date   NA 140 11/12/2020   K 3.6 11/12/2020   CO2 28 11/12/2020   GLUCOSE 106 (H) 11/12/2020   BUN 9 11/12/2020    CREATININE 0.81  11/12/2020   BILITOT 0.4 11/12/2020   ALKPHOS 53 11/12/2020   AST 19 11/12/2020   ALT 14 11/12/2020   PROT 8.5 (H) 11/12/2020   ALBUMIN 4.7 11/12/2020   CALCIUM 9.6 11/12/2020   ANIONGAP 11 11/12/2020   No results found for: "HGBA1C"    Assessment & Plan:   Problem List Items Addressed This Visit       Digestive   Oral candida    R/o other ddx pending results of hiv cbc cmp      Relevant Orders   Comprehensive metabolic panel   QuantiFERON-TB Gold Plus   DG Chest 2 View   HIV Antibody (routine testing w rflx)     Immune and Lymphatic   Lymphadenopathy - Primary    Lymphadenopathy workup in place as well as work up for hiv  Will consider u/s in future if needed      Relevant Orders   Lactate dehydrogenase   CBC with Differential   Comprehensive metabolic panel   QuantiFERON-TB Gold Plus   DG Chest 2 View   HIV Antibody (routine testing w rflx)     Other   Screening for tuberculosis    quantiferon gold ordered        Relevant Orders   QuantiFERON-TB Gold Plus   DG Chest 2 View   HIV Antibody (routine testing w rflx)   Depression with anxiety     I instructed pt to start lexapro 10 mg 1/2 tablet once daily for 1 week and then increase to a full tablet once daily on week two as tolerated.  We discussed common side effects such as nausea, drowsiness and weight gain.  Also discussed rare but serious side effect of suicidal ideation.  She is instructed to discontinue medication and go directly to ED if this occurs.  Pt verbalizes understanding.  Plan is to follow up in 30 days to evaluate progress.          Relevant Medications   escitalopram (LEXAPRO) 10 MG tablet   Screening for STD (sexually transmitted disease)    Safe sex d/w pt .  Std panel ordered today. Pending results.        Relevant Orders   HSV 1 antibody, IgG   HSV 2 antibody, IgG   Hepatitis panel, acute   RPR   HIV Antibody (routine testing w rflx)   Herpes Simplex  Virus 1 and 2 (IgG), with Reflex to HSV-2 Inhibition   Urine cytology ancillary only    Meds ordered this encounter  Medications   escitalopram (LEXAPRO) 10 MG tablet    Sig: Take 1 tablet (10 mg total) by mouth daily.    Dispense:  30 tablet    Refill:  1    Order Specific Question:   Supervising Provider    Answer:   Diona Browner, AMY E [id number]    Follow-up: Return in about 3 weeks (around 07/29/2022) for follow up on medication.    Eugenia Pancoast, FNP

## 2022-07-08 NOTE — Assessment & Plan Note (Signed)
quantiferon gold ordered.

## 2022-07-08 NOTE — Assessment & Plan Note (Signed)
Lymphadenopathy workup in place as well as work up for hiv  Will consider u/s in future if needed

## 2022-07-10 ENCOUNTER — Other Ambulatory Visit: Payer: Self-pay | Admitting: Family

## 2022-07-10 DIAGNOSIS — R591 Generalized enlarged lymph nodes: Secondary | ICD-10-CM

## 2022-07-10 LAB — URINE CYTOLOGY ANCILLARY ONLY
Bacterial Vaginitis-Urine: NEGATIVE
Candida Urine: NEGATIVE
Chlamydia: NEGATIVE
Comment: NEGATIVE
Comment: NEGATIVE
Comment: NORMAL
Neisseria Gonorrhea: NEGATIVE
Trichomonas: NEGATIVE

## 2022-07-10 NOTE — Progress Notes (Signed)
Hepatitis panel negative  Syphillis negative  Hiv negative  Negative for gonorrhea chlamydia BV yeast and or trichomonas  Have pt come back in for lab only. I want to test for mono with all of enlarged lymph nodes. Order placed.

## 2022-07-10 NOTE — Addendum Note (Signed)
Addended by: Alvina Chou on: 07/10/2022 12:57 PM   Modules accepted: Orders

## 2022-07-11 LAB — HSV 2 ANTIBODY, IGG: HSV 2 Glycoprotein G Ab, IgG: 2.64 index — ABNORMAL HIGH

## 2022-07-11 LAB — LACTATE DEHYDROGENASE: LDH: 98 U/L — ABNORMAL LOW (ref 100–200)

## 2022-07-11 LAB — HIV ANTIBODY (ROUTINE TESTING W REFLEX): HIV 1&2 Ab, 4th Generation: NONREACTIVE

## 2022-07-11 LAB — QUANTIFERON-TB GOLD PLUS
Mitogen-NIL: >10 IU/mL
NIL: 0.02 IU/mL
QuantiFERON-TB Gold Plus: NEGATIVE
TB1-NIL: 0 IU/mL
TB2-NIL: 0 IU/mL

## 2022-07-11 LAB — HEPATITIS PANEL, ACUTE
Hep A IgM: NONREACTIVE
Hep B C IgM: NONREACTIVE
Hepatitis B Surface Ag: NONREACTIVE
Hepatitis C Ab: NONREACTIVE

## 2022-07-11 LAB — HSV 1 ANTIBODY, IGG: HSV 1 Glycoprotein G Ab, IgG: 25.7 index — ABNORMAL HIGH

## 2022-07-11 LAB — RPR: RPR Ser Ql: NONREACTIVE

## 2022-07-11 LAB — HERPES SIMPLEX VIRUS 1 AND 2 (IGG),REFLEX HSV-2 INHIBITION
HAV 1 IGG,TYPE SPECIFIC AB: 25.1 index — ABNORMAL HIGH
HSV 2 IGG,TYPE SPECIFIC AB: 2.46 index — ABNORMAL HIGH

## 2022-07-11 LAB — EXTRA SPECIMEN

## 2022-07-11 LAB — HSV 2 INHIBITION

## 2022-07-11 NOTE — Progress Notes (Signed)
Herpes one and two positive Has pt ever experienced any lesions on vagina or mouth sores ?

## 2022-07-14 ENCOUNTER — Other Ambulatory Visit (INDEPENDENT_AMBULATORY_CARE_PROVIDER_SITE_OTHER): Payer: Self-pay

## 2022-07-14 DIAGNOSIS — R591 Generalized enlarged lymph nodes: Secondary | ICD-10-CM

## 2022-07-14 LAB — MONONUCLEOSIS SCREEN: Mono Screen: NEGATIVE

## 2022-07-30 ENCOUNTER — Encounter: Payer: Self-pay | Admitting: *Deleted

## 2022-07-30 ENCOUNTER — Encounter: Payer: Medicaid Other | Admitting: Nurse Practitioner

## 2022-07-30 ENCOUNTER — Encounter: Payer: Self-pay | Admitting: Family

## 2022-07-30 ENCOUNTER — Ambulatory Visit (INDEPENDENT_AMBULATORY_CARE_PROVIDER_SITE_OTHER): Payer: 59 | Admitting: Family

## 2022-07-30 VITALS — BP 110/60 | HR 71 | Temp 98.6°F | Resp 16 | Ht 60.0 in | Wt 105.1 lb

## 2022-07-30 DIAGNOSIS — R519 Headache, unspecified: Secondary | ICD-10-CM | POA: Insufficient documentation

## 2022-07-30 DIAGNOSIS — R5383 Other fatigue: Secondary | ICD-10-CM | POA: Diagnosis not present

## 2022-07-30 DIAGNOSIS — J301 Allergic rhinitis due to pollen: Secondary | ICD-10-CM | POA: Insufficient documentation

## 2022-07-30 DIAGNOSIS — F418 Other specified anxiety disorders: Secondary | ICD-10-CM | POA: Diagnosis not present

## 2022-07-30 DIAGNOSIS — S0990XA Unspecified injury of head, initial encounter: Secondary | ICD-10-CM | POA: Diagnosis not present

## 2022-07-30 HISTORY — DX: Allergic rhinitis due to pollen: J30.1

## 2022-07-30 HISTORY — DX: Headache, unspecified: R51.9

## 2022-07-30 LAB — SEDIMENTATION RATE: Sed Rate: 17 mm/hr (ref 0–20)

## 2022-07-30 LAB — TSH: TSH: 1.55 u[IU]/mL (ref 0.35–5.50)

## 2022-07-30 LAB — B12 AND FOLATE PANEL
Folate: 10.9 ng/mL (ref >5.9)
Vitamin B-12: 466 pg/mL (ref 211–911)

## 2022-07-30 NOTE — Assessment & Plan Note (Signed)
Lab workup with tsh and b12
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
## 2022-07-30 NOTE — Assessment & Plan Note (Signed)
Stat ct head without contrast today.  Pending results.
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
## 2022-07-30 NOTE — Assessment & Plan Note (Signed)
Recommend daily zyrtec to help with allergy symptoms

## 2022-07-30 NOTE — Telephone Encounter (Signed)
FYI, [facility] as well as myself have been unable to reach patient to schedule her CT Head.
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
## 2022-08-01 ENCOUNTER — Other Ambulatory Visit: Payer: No Typology Code available for payment source

## 2022-08-01 NOTE — Telephone Encounter (Signed)
Agree with plan  Thank you for speaking with them.

## 2022-08-01 NOTE — Telephone Encounter (Signed)
Patient called back, relayed my chart message as written below  Patient will call [facility] and schedule CT  Stated she waited in the lobby on Monday, but she had to leave for orientation

## 2023-04-19 ENCOUNTER — Other Ambulatory Visit: Payer: Self-pay

## 2023-04-19 ENCOUNTER — Emergency Department
Admission: EM | Admit: 2023-04-19 | Discharge: 2023-04-19 | Disposition: A | Discharge status: Home / Self Care | Payer: Medicaid Other | Attending: Student in an Organized Health Care Education/Training Program | Admitting: Student in an Organized Health Care Education/Training Program

## 2023-04-19 ENCOUNTER — Emergency Department
Admission: EM | Admit: 2023-04-19 | Discharge: 2023-04-19 | Disposition: A | Discharge status: Home / Self Care | Payer: Medicaid Other | Source: Home / Self Care | Attending: Emergency Medicine | Admitting: Emergency Medicine

## 2023-04-19 DIAGNOSIS — I10 Essential (primary) hypertension: Secondary | ICD-10-CM | POA: Insufficient documentation

## 2023-04-19 DIAGNOSIS — K0889 Other specified disorders of teeth and supporting structures: Secondary | ICD-10-CM | POA: Insufficient documentation

## 2023-04-19 DIAGNOSIS — K047 Periapical abscess without sinus: Secondary | ICD-10-CM | POA: Insufficient documentation

## 2023-04-19 DIAGNOSIS — S025XXA Fracture of tooth (traumatic), initial encounter for closed fracture: Secondary | ICD-10-CM | POA: Diagnosis not present

## 2023-04-19 DIAGNOSIS — X58XXXA Exposure to other specified factors, initial encounter: Secondary | ICD-10-CM | POA: Insufficient documentation

## 2023-04-19 DIAGNOSIS — S00502A Unspecified superficial injury of oral cavity, initial encounter: Secondary | ICD-10-CM | POA: Diagnosis present

## 2023-04-19 MED ORDER — OXYCODONE-ACETAMINOPHEN 5-325 MG PO TABS
1.0000 | ORAL_TABLET | Freq: Once | ORAL | Status: AC
  Administered 2023-04-19: 1 via ORAL
  Filled 2023-04-19: qty 1

## 2023-04-19 MED ORDER — AMOXICILLIN-POT CLAVULANATE 875-125 MG PO TABS: 1.0000 | ORAL_TABLET | Freq: Two times a day (BID) | ORAL | 0 refills | Status: AC

## 2023-04-19 MED ORDER — TRAMADOL HCL 50 MG PO TABS
50.0000 mg | ORAL_TABLET | Freq: Once | ORAL | Status: AC
  Administered 2023-04-19: 50 mg via ORAL
  Filled 2023-04-19: qty 1

## 2023-04-19 MED ORDER — AMOXICILLIN-POT CLAVULANATE 875-125 MG PO TABS
1.0000 | ORAL_TABLET | Freq: Once | ORAL | Status: AC
  Administered 2023-04-19: 1 via ORAL
  Filled 2023-04-19: qty 1

## 2023-04-19 MED ORDER — TRAMADOL HCL 50 MG PO TABS: 50.0000 mg | ORAL_TABLET | Freq: Four times a day (QID) | ORAL | 0 refills | Status: DC | PRN

## 2023-04-19 NOTE — Discharge Instructions (Addendum)
Please follow-up with the dental options that were provided to earlier today.  Please fill your medications for the pain medicine and antibiotic that were provided earlier today.  Please follow-up with a dentist as soon as possible.

## 2023-04-19 NOTE — ED Triage Notes (Signed)
Pt reports was just discharged and the medication they gave her for pain is not working so she is checking back in for something else for pain that works.

## 2023-04-19 NOTE — Discharge Instructions (Signed)
Please take antibiotics as prescribed.  Continue with Tylenol and ibuprofen.  You may use tramadol as needed for moderate to severe pain.  Call dental clinic to schedule follow-up appointment as soon as possible.  Return to the ER for any fevers worsening symptoms or any urgent changes in your health

## 2023-04-19 NOTE — ED Provider Notes (Signed)
   [facility] Provider Note    Event Date/Time   First MD Initiated Contact with Patient 04/19/23 1120     (approximate)  History   Chief Complaint: Dental Pain  HPI  Susan Conrad is a 27 y.o. female presents to the emergency department for left lower dental pain.  According to the patient she was seen in the emergency department earlier today for left lower dental pain where she has what appears to be an infected left lower wisdom tooth.  Patient states she was given a prescription for tramadol and antibiotics.  However while waiting for her ride for the last hour the pain is worse and so she checked back into the emergency department.  Denies any fever.  Afebrile in the emergency department with reassuring vitals besides mild hypertension.  Physical Exam   Triage Vital Signs: ED Triage Vitals  Enc Vitals Group     BP 04/19/23 1118 (!) 148/105     Pulse Rate 04/19/23 1118 63     Resp 04/19/23 1118 16     Temp 04/19/23 1118 97.8 F (36.6 C)     Temp src --      SpO2 04/19/23 1118 100 %     Weight 04/19/23 1114 103 lb 9.9 oz (47 kg)     Height 04/19/23 1114 5\' 5"  (1.651 m)     Head Circumference --      Peak Flow --      Pain Score 04/19/23 1114 10     Pain Loc --      Pain Edu? --      Excl. in GC? --     Most recent vital signs: Vitals:   04/19/23 1118  BP: (!) 148/105  Pulse: 63  Resp: 16  Temp: 97.8 F (36.6 C)  SpO2: 100%    General: Awake, no distress.  CV:  Good peripheral perfusion.  Resp:  Normal effort Other:  Patient has what appears to be a fractured and decayed left lower wisdom tooth/molar.  No sign of abscess.   ED Results / Procedures / Treatments   MEDICATIONS ORDERED IN ED: Medications - No data to display   IMPRESSION / MDM / ASSESSMENT AND PLAN / ED COURSE  I reviewed the triage vital signs and the nursing notes.  Patient's presentation is most consistent with acute illness / injury with system  symptoms.  Patient presents emergency department for continued pain in her left lower molar/wisdom tooth where she appears to have decayed and fractured tooth just below the gumline.  Discussed with the patient the importance of following up with a dentist as soon as possible.  Patient has a list of dental options that was given to her earlier today.  Patient was given a prescription for Augmentin as well as tramadol.  We will dose a one-time dose of Percocet in the emergency department however we will not discharge with this medication as patient has already been prescribed tramadol.  Patient states she will call the dental options numbers tomorrow morning to arrange a follow-up appointment soon as possible.  FINAL CLINICAL IMPRESSION(S) / ED DIAGNOSES   Dental pain Dental cavity  Note:  This document was prepared using Dragon voice recognition software and may include unintentional dictation errors.   Minna Antis, MD 04/19/23 [phone]

## 2023-04-19 NOTE — ED Triage Notes (Signed)
Pt brought in via ems from home due to left sided dental pain. Pt denies any recent injuries. Pt is A&Ox4.

## 2023-04-19 NOTE — ED Provider Notes (Signed)
[place] EMERGENCY DEPARTMENT AT [facility] Provider Note   CSN: [id number] Arrival date & time: 04/19/23  7829     History  Chief Complaint  Patient presents with   Dental Pain    Susan Conrad is a 27 y.o. female.  Presents to the emergency department for evaluation of dental pain.  Dental pain has been present for 2 months.  She had a fractured/broken tooth, #17.  She has been fine up until last few days she has been having some pain, swelling and sensitivity.  No fevers chills or difficulty swallowing  HPI     Home Medications Prior to Admission medications   Medication Sig Start Date End Date Taking? Authorizing Provider  amoxicillin-clavulanate (AUGMENTIN) 875-125 MG tablet Take 1 tablet by mouth 2 (two) times daily for 7 days. 04/19/23 04/26/23 Yes Evon Slack, PA-C  traMADol (ULTRAM) 50 MG tablet Take 1 tablet (50 mg total) by mouth every 6 (six) hours as needed. 04/19/23  Yes Evon Slack, PA-C  escitalopram (LEXAPRO) 10 MG tablet Take 1 tablet (10 mg total) by mouth daily. 07/08/22   Mort Sawyers, FNP      Allergies    Patient has no known allergies.    Review of Systems   Review of Systems  Physical Exam Updated Vital Signs BP (!) 146/77   Pulse 66   Temp 98 F (36.7 C) (Oral)   Resp 17   SpO2 100%  Physical Exam Constitutional:      Appearance: She is well-developed.  HENT:     Head: Normocephalic and atraumatic.     Comments: Tooth #17 fractured, there appears to be some dental caries and decay present.  No fluctuant abscess, purulent drainage noted.  She is sensitive to palpation to tooth #17.  No facial swelling or trismus. Eyes:     Conjunctiva/sclera: Conjunctivae normal.  Cardiovascular:     Rate and Rhythm: Normal rate.  Pulmonary:     Effort: Pulmonary effort is normal. No respiratory distress.  Musculoskeletal:        General: Normal range of motion.     Cervical back: Normal range of motion.  Skin:    General: Skin is  warm.     Findings: No rash.  Neurological:     Mental Status: She is alert and oriented to person, place, and time.  Psychiatric:        Behavior: Behavior normal.        Thought Content: Thought content normal.     ED Results / Procedures / Treatments   Labs (all labs ordered are listed, but only abnormal results are displayed) Labs Reviewed - No data to display  EKG None  Radiology No results found.  Procedures Procedures    Medications Ordered in ED Medications  traMADol (ULTRAM) tablet 50 mg (has no administration in time range)  amoxicillin-clavulanate (AUGMENTIN) 875-125 MG per tablet 1 tablet (has no administration in time range)    ED Course/ Medical Decision Making/ A&P                             Medical Decision Making Risk Prescription drug management.   27 year old female with broken tooth, #17 2 months ago.  She has had pain over the last few days.  No relief with Tylenol and ibuprofen.  Vital signs are stable, afebrile no trismus.  Will place on Augmentin and tramadol.  She will continue with Tylenol and  ibuprofen.  She will follow-up with dental clinic. Final Clinical Impression(s) / ED Diagnoses Final diagnoses:  Dental infection  Toothache  Closed fracture of tooth, initial encounter    Rx / DC Orders ED Discharge Orders          Ordered    traMADol (ULTRAM) 50 MG tablet  Every 6 hours PRN        04/19/23 1010    amoxicillin-clavulanate (AUGMENTIN) 875-125 MG tablet  2 times daily        04/19/23 1010              Ronnette Juniper 04/19/23 1014    Willy Eddy, MD 04/19/23 1026

## 2023-04-20 ENCOUNTER — Telehealth: Payer: Self-pay

## 2023-04-20 NOTE — Transitions of Care (Post Inpatient/ED Visit) (Unsigned)
   04/20/2023  Name: TAKITA RIECKE MRN: [id number] DOB: 12-08-1996  Today's TOC FU Call Status:    Attempted to reach the patient regarding the most recent Inpatient/ED visit.  Follow Up Plan: No further outreach attempts will be made at this time. We have been unable to contact the patient.  Signature  Fredirick Maudlin [facility]

## 2023-04-21 NOTE — Transitions of Care (Post Inpatient/ED Visit) (Signed)
Unable to speak with pt; a lady answered phone and said pt is not there now and I requested for pt to call back at [phone].     04/21/2023  Name: Susan Conrad MRN: [id number] DOB: 1996-08-01  Today's TOC FU Call Status: Today's TOC FU Call Status:: Unsuccessful Call (2nd Attempt) Unsuccessful Call (1st Attempt) Date: 04/20/23 Unsuccessful Call (2nd Attempt) Date: 04/21/23  Attempted to reach the patient regarding the most recent Inpatient/ED visit.  Follow Up Plan: Additional outreach attempts will be made to reach the patient to complete the Transitions of Care (Post Inpatient/ED visit) call.   Signature Lewanda Rife, LPN

## 2023-04-23 NOTE — Transitions of Care (Post Inpatient/ED Visit) (Signed)
Unable to reach pt by phone; a lady answered phone and said pt was not there and she would tell pt to call office [phone]. This is second request for pt to call [facility]. No DPR signed.        04/23/2023  Name: Susan Conrad MRN: [id number] DOB: 1996/04/04  Today's TOC FU Call Status: Today's TOC FU Call Status:: Unsuccessful Call (3rd Attempt) Unsuccessful Call (1st Attempt) Date: 04/20/23 Unsuccessful Call (2nd Attempt) Date: 04/21/23 Unsuccessful Call (3rd Attempt) Date: 04/23/23  Attempted to reach the patient regarding the most recent Inpatient/ED visit.  Follow Up Plan: No further outreach attempts will be made at this time. We have been unable to contact the patient.  Signature Lewanda Rife, LPN

## 2023-05-08 ENCOUNTER — Ambulatory Visit: Payer: Medicaid Other | Admitting: Family

## 2023-05-08 ENCOUNTER — Encounter: Payer: Self-pay | Admitting: Family

## 2023-05-08 VITALS — BP 118/74 | HR 68 | Temp 97.6°F | Ht 65.0 in | Wt 111.6 lb

## 2023-05-08 DIAGNOSIS — F428 Other obsessive-compulsive disorder: Secondary | ICD-10-CM

## 2023-05-08 DIAGNOSIS — Z9189 Other specified personal risk factors, not elsewhere classified: Secondary | ICD-10-CM

## 2023-05-08 DIAGNOSIS — F418 Other specified anxiety disorders: Secondary | ICD-10-CM

## 2023-05-08 LAB — T3, FREE: T3, Free: 2.7 pg/mL (ref 2.3–4.2)

## 2023-05-08 LAB — T4, FREE: Free T4: 0.81 ng/dL (ref 0.60–1.60)

## 2023-05-08 LAB — TSH: TSH: 1.01 u[IU]/mL (ref 0.35–5.50)

## 2023-05-08 MED ORDER — ESCITALOPRAM OXALATE 10 MG PO TABS: 10.0000 mg | ORAL_TABLET | Freq: Every day | ORAL | 3 refills | Status: AC

## 2023-05-08 NOTE — Patient Instructions (Addendum)
Ask your place of employment about FMLA and if you are you eligible.   These places may be good to call to see if you are able to get an appointment   [facility] Phone # [phone] They have locations in [place], [place], [place], and [place]. They do take [employer] I know for sure and the [employer].   [place] [address] [phone]  [address] [place], [place], [place] [address]    [employer] [facility] [employer] [address] [place], [place] [address]   [phone]   If at any time you feel your needs are more urgent or you are concerned for your well being please see the below options:  For walk in options for mental health:   [facility] [address] in [place], [place]. Call our 24-hour HelpLine at [phone] or [phone] for immediate assistance for mental health and substance abuse issues.  And or walk into [facility] hospital ER.   [employer] [facility]: 1-800-SUICIDE  The [employer] Suicide Prevention Lifeline: 1-800-273-TALK  Regards,   Mort Sawyers FNP-C

## 2023-05-08 NOTE — Progress Notes (Unsigned)
Established Patient Office Visit  Subjective:      CC:  Chief Complaint  Patient presents with   Medical Management of Chronic Issues    HPI: Susan Conrad is a 27 y.o. female presenting on 05/08/2023 for Medical Management of Chronic Issues . Anxiety depression:  Has a peer support system and she is getting set up with a therapist which she will see later today. She started a new job over the last two weeks which she finds is very stressful and hard for her to keep working. She is working at [employer]. Harder to sleep and increased stress. She is currently taking lexapro 10 mg once daily which was helpful but she states only taking it about 3 times a week, not daily.   She states that she can barely sleeps and at times she only sleeps for a few minutes to hours. She also feels she has zero energy at all. She pushes herself to get through the day. She also states that she has constant running thoughts that are hard to turn off. She has had emotional and physical stress.   She does state that dad went to [facility] and he has been dx with PTSD, depression , anxiety and bipolar.     Social history:  Relevant past medical, surgical, family and social history reviewed and updated as indicated. Interim medical history since our last visit reviewed.  Allergies and medications reviewed and updated.  DATA REVIEWED: CHART IN EPIC     ROS: Negative unless specifically indicated above in HPI.    Current Outpatient Medications:    escitalopram (LEXAPRO) 10 MG tablet, Take 1 tablet (10 mg total) by mouth daily., Disp: 90 tablet, Rfl: 3      Objective:    BP 118/74   Pulse 68   Temp 97.6 F (36.4 C) (Temporal)   Ht 5\' 5"  (1.651 m)   Wt 111 lb 9.6 oz (50.6 kg)   LMP  (LMP Unknown)   SpO2 98%   BMI 18.57 kg/m   Wt Readings from Last 3 Encounters:  05/08/23 111 lb 9.6 oz (50.6 kg)  04/19/23 103 lb 9.9 oz (47 kg)  07/30/22 105 lb 2 oz (47.7 kg)    Physical Exam Constitutional:       General: She is not in acute distress.    Appearance: Normal appearance. She is normal weight. She is not ill-appearing, toxic-appearing or diaphoretic.  HENT:     Head: Normocephalic.  Cardiovascular:     Rate and Rhythm: Normal rate and regular rhythm.  Pulmonary:     Effort: Pulmonary effort is normal.  Musculoskeletal:        General: Normal range of motion.  Neurological:     General: No focal deficit present.     Mental Status: She is alert and oriented to person, place, and time. Mental status is at baseline.  Psychiatric:        Mood and Affect: Mood normal.        Behavior: Behavior normal.        Thought Content: Thought content normal.        Judgment: Judgment normal.           Assessment & Plan:  Obsessional thoughts -     Ambulatory referral to Psychiatry  Depression with anxiety Assessment & Plan: Start lexapro 10 mg once daily  Start taking daily rather than as needed  Referral placed for psychiatrist suspect possible mood disorder hx bipolar in the  family as well.  Pt to work with case Production designer, theatre/television/film for psychology placement   Orders: -     Escitalopram Oxalate; Take 1 tablet (10 mg total) by mouth daily.  Dispense: 90 tablet; Refill: 3 -     Ambulatory referral to Psychiatry -     TSH -     T4, free -     T3, free  At increased risk for stress -     TSH -     T4, free -     T3, free     Return in about 1 month (around 06/08/2023) for f/u anxiety.  Mort Sawyers, MSN, APRN, FNP-C [facility]

## 2023-05-08 NOTE — Assessment & Plan Note (Signed)
Start lexapro 10 mg once daily  Start taking daily rather than as needed  Referral placed for psychiatrist suspect possible mood disorder hx bipolar in the family as well.  Pt to work with case Production designer, theatre/television/film for psychology placement

## 2023-05-14 ENCOUNTER — Encounter: Payer: Self-pay | Admitting: *Deleted

## 2023-06-03 ENCOUNTER — Ambulatory Visit: Payer: Medicaid Other | Admitting: Family

## 2023-06-08 ENCOUNTER — Ambulatory Visit: Payer: Medicaid Other | Admitting: Family

## 2023-06-09 ENCOUNTER — Encounter: Payer: Self-pay | Admitting: Family

## 2023-06-12 ENCOUNTER — Encounter: Payer: Self-pay | Admitting: Internal Medicine

## 2023-06-12 ENCOUNTER — Ambulatory Visit (INDEPENDENT_AMBULATORY_CARE_PROVIDER_SITE_OTHER): Payer: Medicaid Other | Admitting: Internal Medicine

## 2023-06-12 VITALS — BP 88/60 | HR 75 | Temp 97.6°F | Ht 65.0 in | Wt 112.0 lb

## 2023-06-12 DIAGNOSIS — K529 Noninfective gastroenteritis and colitis, unspecified: Secondary | ICD-10-CM | POA: Diagnosis not present

## 2023-06-12 NOTE — Assessment & Plan Note (Signed)
Fairly mild--likely viral or mild food poisoning Discussed keeping up with fluids Nothing to suggest appendicitis or other pathology--but would recommend further evaluation OOW till 6/24--note given

## 2023-06-12 NOTE — Progress Notes (Signed)
Subjective:    Patient ID: Susan Conrad, female    DOB: 1996/02/11, 27 y.o.   MRN: [id number]  HPI Here due to diarrhea  Started with watery diarrhea yesterday morning ~ 3 episodes yesterday Cramp and one loose stool this morning Feels weak Some nausea--did throw up some mucus after coughing Felt hot last night--?fever Some dizziness--and sees dots in front of her eyes Slight abdominal pain--LLQ mostly  No meds for this  Current Outpatient Medications on File Prior to Visit  Medication Sig Dispense Refill   escitalopram (LEXAPRO) 10 MG tablet Take 1 tablet (10 mg total) by mouth daily. 90 tablet 3   mirtazapine (REMERON) 15 MG tablet Take 15 mg by mouth at bedtime.     No current facility-administered medications on file prior to visit.    No Known Allergies  Past Medical History:  Diagnosis Date   Migraine headache     Past Surgical History:  Procedure Laterality Date   NO PAST SURGERIES      Family History  Problem Relation Age of Onset   Asthma Mother    Heart disease Father    Mental illness Father    Bipolar disorder Father    Anxiety disorder Father    Depression Father    Post-traumatic stress disorder Father    Asthma Sister    Thyroid disease Maternal Grandmother    Diabetes Maternal Grandfather    Heart disease Maternal Grandfather    Hypertension Maternal Grandfather    Stroke Maternal Grandfather    Breast cancer Paternal Grandmother    Hearing loss Neg Hx     Social History   Socioeconomic History   Marital status: Single    Spouse name: Not on file   Number of children: Not on file   Years of education: Not on file   Highest education level: Not on file  Occupational History   Not on file  Tobacco Use   Smoking status: Every Day    Packs/day: 0.25    Years: 11.00    Additional pack years: 0.00    Total pack years: 2.75    Types: E-cigarettes, Cigarettes   Smokeless tobacco: Never  Vaping Use   Vaping Use: Every day   Substance and Sexual Activity   Alcohol use: Yes    Comment: "every now and then"   Drug use: Never   Sexual activity: Yes    Partners: Male    Birth control/protection: None  Other Topics Concern   Not on file  Social History Narrative   Not on file   Social Determinants of Health   Financial Resource Strain: Not on file  Food Insecurity: Not on file  Transportation Needs: Not on file  Physical Activity: Not on file  Stress: Not on file  Social Connections: Not on file  Intimate Partner Violence: Not At Risk (06/13/2021)   Humiliation, Afraid, Rape, and Kick questionnaire    Fear of Current or Ex-Partner: No    Emotionally Abused: No    Physically Abused: No    Sexually Abused: No   Review of Systems No dysuria or hematuria---but urine dark Not eating much Trying to keep up with fluids No clear tainted food No ill exposures    Objective:   Physical Exam Constitutional:      Appearance: Normal appearance.  Pulmonary:     Effort: Pulmonary effort is normal.     Breath sounds: Normal breath sounds. No wheezing or rales.  Abdominal:  Palpations: Abdomen is soft.     Tenderness: There is no abdominal tenderness. There is no guarding or rebound.  Musculoskeletal:     Cervical back: Neck supple.  Lymphadenopathy:     Cervical: No cervical adenopathy.  Neurological:     Mental Status: She is alert.  Psychiatric:        Mood and Affect: Mood normal.        Behavior: Behavior normal.            Assessment & Plan:

## 2023-07-29 ENCOUNTER — Ambulatory Visit: Payer: Medicaid Other | Admitting: Family

## 2023-07-30 ENCOUNTER — Encounter: Payer: Self-pay | Admitting: Family

## 2023-09-21 ENCOUNTER — Ambulatory Visit: Payer: Medicaid Other | Admitting: Family

## 2023-09-22 ENCOUNTER — Encounter: Payer: Self-pay | Admitting: Family

## 2023-09-28 ENCOUNTER — Ambulatory Visit: Payer: Medicaid Other | Admitting: Family

## 2023-10-08 ENCOUNTER — Telehealth: Payer: Self-pay | Admitting: Family

## 2023-10-08 ENCOUNTER — Ambulatory Visit: Payer: Medicaid Other | Admitting: Family

## 2023-10-08 NOTE — Telephone Encounter (Signed)
A warning letter making the patient aware that if she misses one more appointment with Tabitha, that she will be dismissed from her care has been mailed to the patient per Tabitha's request.

## 2024-01-12 ENCOUNTER — Encounter: Payer: Self-pay | Admitting: Family

## 2024-01-13 ENCOUNTER — Ambulatory Visit: Payer: Medicaid Other | Admitting: Family

## 2024-01-13 NOTE — Telephone Encounter (Signed)
LM for pt to change her visit to video or reschedule.

## 2024-02-22 ENCOUNTER — Other Ambulatory Visit: Payer: Self-pay

## 2024-02-22 ENCOUNTER — Emergency Department
Admission: EM | Admit: 2024-02-22 | Discharge: 2024-02-22 | Disposition: A | Discharge status: Home / Self Care | Attending: Emergency Medicine | Admitting: Emergency Medicine

## 2024-02-22 DIAGNOSIS — N912 Amenorrhea, unspecified: Secondary | ICD-10-CM | POA: Diagnosis present

## 2024-02-22 LAB — POC URINE PREG, ED: Preg Test, Ur: NEGATIVE

## 2024-02-22 NOTE — ED Triage Notes (Signed)
 Patient states LMP 01/04/2024; has had multiple negative home pregnancy tests.

## 2024-02-22 NOTE — ED Provider Notes (Signed)
   [facility] Provider Note    Event Date/Time   First MD Initiated Contact with Patient 02/22/24 1129     (approximate)   History   Possible Pregnancy   HPI  Susan Conrad is a 28 y.o. female  who presents for evaluation of a possible pregnancy. Patient states she missed her period and is normally regular. LMP was 01/04/2024. Denies any vaginal pain or abnormal discharge. No urinary symptoms. She has taken multiple pregnancy tests at home that have been negative.  She is sexually active and does not take any birth control.      Physical Exam   Triage Vital Signs: ED Triage Vitals 02/22/24 1058  Encounter Vitals Group     BP      Systolic BP Percentile      Diastolic BP Percentile      Pulse Rate 74     Resp 18     Temp 97.8 F (36.6 C)     Temp Source Oral     SpO2 100 %     Weight 120 lb (54.4 kg)     Height 5' (1.524 m)     Head Circumference      Peak Flow      Pain Score 0     Pain Loc      Pain Education      Exclude from Growth Chart     Most recent vital signs: Vitals:   02/22/24 1058  Pulse: 74  Resp: 18  Temp: 97.8 F (36.6 C)  SpO2: 100%   General: Awake, no distress.  CV:  Good peripheral perfusion.  Resp:  Normal effort.  Abd:  No distention.     ED Results / Procedures / Treatments   Labs (all labs ordered are listed, but only abnormal results are displayed) Labs Reviewed  POC URINE PREG, ED   PROCEDURES:  Critical Care performed: No  Procedures   MEDICATIONS ORDERED IN ED: Medications - No data to display   IMPRESSION / MDM / ASSESSMENT AND PLAN / ED COURSE  I reviewed the triage vital signs and the nursing notes.                             28 year old female presents for pregnancy test.  Vital signs are stable patient NAD on exam.  Differential diagnosis includes, but is not limited to, pregnancy, UTI, STI, functional hypothalamic amenorrhea, hyperprolactinemia, hyperthyroidism,  hypothyroidism, PCOS.  Patient's presentation is most consistent with acute, uncomplicated illness.  Pregnancy test is negative.  Patient does not want any further workup.  She will be given follow-up information for OB.  She voiced understanding, all questions were answered and she was stable at discharge.     FINAL CLINICAL IMPRESSION(S) / ED DIAGNOSES   Final diagnoses:  Amenorrhea     Rx / DC Orders   ED Discharge Orders     None        Note:  This document was prepared using Dragon voice recognition software and may include unintentional dictation errors.   Cameron Ali, PA-C 02/22/24 1243    Merwyn Katos, MD 02/22/24 1537

## 2024-02-22 NOTE — ED Notes (Signed)
 Pt seen walking down the hall with blanket in hand. This Clinical research associate asked if she was ok to which she replied "yes" and continued walking down the hall. Provider made aware.

## 2024-02-26 ENCOUNTER — Telehealth: Payer: Self-pay

## 2024-02-26 NOTE — Transitions of Care (Post Inpatient/ED Visit) (Signed)
 I spoke with pt; pt did go to [facility] ED on 02/22/24 both sides below waist had sharp pain on and off. low back pain also. LMP 01/04/24 pt sexually active; not using any birth control; urine pregnancy test at ED neg. today pt feels OK no pain; pt started normal menstrual cycle on 02/23/24. Pt has not scheduled appt with OB GYN yet. Pt scheduled appt with Hayden Pedro FNP on 02/29/24. Pt wants to be seen to try and determine why pt did not have menstrual period during Feb. ED mentioned no period could be a lot of different reasons and pt wonders if could be problem with thyroid?  Pt given UC & ED precautions and pt voiced understanding. Sending note to Hayden Pedro FNP.       02/26/2024  Name: Susan Conrad MRN: [id number] DOB: 03-15-1996  Today's TOC FU Call Status: Today's TOC FU Call Status:: Successful TOC FU Call Completed TOC FU Call Complete Date: 02/26/24 Patient's Name and Date of Birth confirmed.  Transition Care Management Follow-up Telephone Call Date of Discharge: 02/22/24 Discharge Facility: [facility]) Type of Discharge: Emergency Department Reason for ED Visit: Other: (pt did go to [facility] ED both sides below waist had sharp pain on and off. low back pain also. LMP 01/04/24  pt sexually active; not using any birth control pregnancy test at ED  neg. today pt feels OK no pain; pt started normal menstrual  [facility] ib 02/23/24.) How have you been since you were released from the hospital?: Better Any questions or concerns?: No  Items Reviewed: Did you receive and understand the discharge instructions provided?: Yes Medications obtained,verified, and reconciled?: No Medications Not Reviewed Reasons:: Other: (t said ED did not give her any med.) Any new allergies since your discharge?: No Dietary orders reviewed?: NA Do you have support at home?: Yes People in Home: parent(s) Name of Support/Comfort Primary Source: Sarah  Medications Reviewed Today: Medications  Reviewed Today   Medications were not reviewed in this encounter     Home Care and Equipment/Supplies: Were Home Health Services Ordered?: NA Any new equipment or medical supplies ordered?: NA  Functional Questionnaire: Do you need assistance with bathing/showering or dressing?: No Do you need assistance with meal preparation?: No Do you need assistance with eating?: No Do you have difficulty maintaining continence: No Do you need assistance with getting out of bed/getting out of a chair/moving?: No Do you have difficulty managing or taking your medications?: No  Follow up appointments reviewed: PCP Follow-up appointment confirmed?: Yes Date of PCP follow-up appointment?: 02/29/24 Follow-up Provider: Mort Sawyers FNP Specialist Hospital Follow-up appointment confirmed?: No Reason Specialist Follow-Up Not Confirmed: Patient has Specialist Provider Number and will Call for Appointment Do you need transportation to your follow-up appointment?: No Do you understand care options if your condition(s) worsen?: Yes-patient verbalized understanding    SIGNATURE Lewanda Rife, LPN

## 2024-02-29 ENCOUNTER — Encounter: Payer: Self-pay | Admitting: Family

## 2024-02-29 ENCOUNTER — Ambulatory Visit: Admitting: Family

## 2024-02-29 ENCOUNTER — Telehealth: Payer: Self-pay | Admitting: Family

## 2024-02-29 NOTE — Telephone Encounter (Signed)
 I see where the note is that a letter was sent, but I do not see the letter itself in the computer? Where can I look just to make sure we have appropriate documentation?

## 2024-02-29 NOTE — Telephone Encounter (Signed)
Pt no-showed today's appt.

## 2024-02-29 NOTE — Telephone Encounter (Signed)
 It's under the "letters" tab in the chart.

## 2024-02-29 NOTE — Telephone Encounter (Signed)
 Today made the pt's 6th no show. We sent her a warning letter on 10/08/23. Would you like to discharge the pt?

## 2024-02-29 NOTE — Telephone Encounter (Signed)
 Thanks for keeping me in the loop. I will enter the dismissal edits in EPIC. The official dismissal will become effective 30 days from today's date for [facility].

## 2024-02-29 NOTE — Telephone Encounter (Signed)
 Letter has been drafted, printed and mailed to pt.

## 2024-02-29 NOTE — Telephone Encounter (Signed)
 Thank you! Wasn't sure where, I see it now!  Due to 6 no shows I feel this is fair for dismissal. Please send letter. Amy, FYI.

## 2024-04-08 ENCOUNTER — Ambulatory Visit: Payer: Self-pay

## 2024-04-08 NOTE — Telephone Encounter (Signed)
 X3 unsuccessful attempts to reach pt: left voicemail  requesting pt to callback: will route pt chart to PCP office
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
## 2024-04-11 NOTE — Telephone Encounter (Signed)
 Noted.  Notably pt has been dismissed from our practice.

## 2024-06-14 ENCOUNTER — Ambulatory Visit

## 2024-06-14 VITALS — BP 120/73 | Ht 60.0 in | Wt 121.0 lb

## 2024-06-14 DIAGNOSIS — Z309 Encounter for contraceptive management, unspecified: Secondary | ICD-10-CM | POA: Diagnosis not present

## 2024-06-14 DIAGNOSIS — Z3201 Encounter for pregnancy test, result positive: Secondary | ICD-10-CM

## 2024-06-14 LAB — PREGNANCY, URINE: Preg Test, Ur: POSITIVE — AB

## 2024-06-14 MED ORDER — PRENATAL 27-0.8 MG PO TABS: 1.0000 | ORAL_TABLET | Freq: Every day | ORAL | Status: AC
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
## 2024-06-30 ENCOUNTER — Other Ambulatory Visit (INDEPENDENT_AMBULATORY_CARE_PROVIDER_SITE_OTHER): Payer: Self-pay

## 2024-06-30 ENCOUNTER — Ambulatory Visit: Admitting: *Deleted

## 2024-06-30 VITALS — BP 99/62 | HR 56 | Wt 123.0 lb

## 2024-06-30 DIAGNOSIS — Z3A09 9 weeks gestation of pregnancy: Secondary | ICD-10-CM

## 2024-06-30 DIAGNOSIS — Z3401 Encounter for supervision of normal first pregnancy, first trimester: Secondary | ICD-10-CM

## 2024-06-30 DIAGNOSIS — O3680X0 Pregnancy with inconclusive fetal viability, not applicable or unspecified: Secondary | ICD-10-CM

## 2024-06-30 DIAGNOSIS — O09292 Supervision of pregnancy with other poor reproductive or obstetric history, second trimester: Secondary | ICD-10-CM | POA: Insufficient documentation

## 2024-06-30 NOTE — Progress Notes (Signed)
 New OB Intake  I explained I am completing New OB Intake today. We discussed EDD of 02/01/2025, by Last Menstrual Period. Pt is G1P0000. I reviewed her allergies, medications and Medical/Surgical/OB history.    Patient Active Problem List   Diagnosis Date Noted   Encounter for supervision of normal first pregnancy in first trimester 06/30/2024   Nonintractable headache 07/30/2022   Depression with anxiety 07/08/2022   Smoker 1/2-3/4 ppd 06/01/2020   Marijuana use daily 06/01/2020    Concerns addressed today  Patient informed that the ultrasound is considered a limited obstetric ultrasound and is not intended to be a complete ultrasound exam.  Patient also informed that the ultrasound is not being completed with the intent of assessing for fetal or placental anomalies or any pelvic abnormalities. Explained that the purpose of today's ultrasound is to assess for viability.  Patient acknowledges the purpose of the exam and the limitations of the study.     Delivery Plans Plans to deliver at [facility]. Discussed the nature of our practice with multiple providers including residents and students. Due to the size of the practice, the delivering provider may not be the same as those providing prenatal care.   MyChart/Babyscripts MyChart access verified. I explained pt will have some visits in office and some virtually. Babyscripts app discussed and ordered.   Blood Pressure Cuff Blood pressure cuff discussed and given. Discussed to be used for virtual visits and or if needed BP checks weekly.  Anatomy US  Explained first scheduled US  will be around 19 weeks.   Last Pap  06/13/2021-WNL   First visit review I reviewed new OB appt with patient. Explained pt will be seen by Claris, CNM at first visit. Discussed [place] genetic screening with patient will get drawn at [facility]. Routine prenatal labs to be collected at [facility].    Wanda Buckles, RN 06/30/2024  10:35 AM

## 2024-07-20 ENCOUNTER — Ambulatory Visit (INDEPENDENT_AMBULATORY_CARE_PROVIDER_SITE_OTHER): Admitting: Certified Nurse Midwife

## 2024-07-20 ENCOUNTER — Encounter: Payer: Self-pay | Admitting: Certified Nurse Midwife

## 2024-07-20 ENCOUNTER — Other Ambulatory Visit (HOSPITAL_COMMUNITY)
Admission: RE | Admit: 2024-07-20 | Discharge: 2024-07-20 | Disposition: A | Discharge status: Home / Self Care | Source: Ambulatory Visit | Attending: Certified Nurse Midwife | Admitting: Certified Nurse Midwife

## 2024-07-20 VITALS — BP 119/82 | HR 77 | Wt 125.0 lb

## 2024-07-20 DIAGNOSIS — Z1331 Encounter for screening for depression: Secondary | ICD-10-CM

## 2024-07-20 DIAGNOSIS — O99320 Drug use complicating pregnancy, unspecified trimester: Secondary | ICD-10-CM

## 2024-07-20 DIAGNOSIS — Z3401 Encounter for supervision of normal first pregnancy, first trimester: Secondary | ICD-10-CM

## 2024-07-20 DIAGNOSIS — O219 Vomiting of pregnancy, unspecified: Secondary | ICD-10-CM

## 2024-07-20 DIAGNOSIS — O99321 Drug use complicating pregnancy, first trimester: Secondary | ICD-10-CM | POA: Diagnosis not present

## 2024-07-20 DIAGNOSIS — Z3A12 12 weeks gestation of pregnancy: Secondary | ICD-10-CM | POA: Diagnosis not present

## 2024-07-20 DIAGNOSIS — O99331 Smoking (tobacco) complicating pregnancy, first trimester: Secondary | ICD-10-CM

## 2024-07-20 DIAGNOSIS — Z3A11 11 weeks gestation of pregnancy: Secondary | ICD-10-CM | POA: Insufficient documentation

## 2024-07-20 DIAGNOSIS — D492 Neoplasm of unspecified behavior of bone, soft tissue, and skin: Secondary | ICD-10-CM

## 2024-07-20 DIAGNOSIS — Z3491 Encounter for supervision of normal pregnancy, unspecified, first trimester: Secondary | ICD-10-CM | POA: Insufficient documentation

## 2024-07-20 DIAGNOSIS — O99330 Smoking (tobacco) complicating pregnancy, unspecified trimester: Secondary | ICD-10-CM

## 2024-07-20 DIAGNOSIS — F1290 Cannabis use, unspecified, uncomplicated: Secondary | ICD-10-CM | POA: Diagnosis not present

## 2024-07-20 DIAGNOSIS — F439 Reaction to severe stress, unspecified: Secondary | ICD-10-CM

## 2024-07-20 MED ORDER — ONDANSETRON HCL 4 MG PO TABS: 4.0000 mg | ORAL_TABLET | Freq: Three times a day (TID) | ORAL | 0 refills | Status: AC | PRN

## 2024-07-20 NOTE — Progress Notes (Signed)
 INITIAL PRENATAL VISIT  History:  Susan Conrad is a 28 y.o. G1P0000 at [redacted]w[redacted]d by LMP being seen today for her first obstetrical visit.  Her obstetrical history is significant for smoker and marijuana use in pregnancy. Also complicated by nausea and vomiting. Patient does intend to breast feed. Pregnancy history fully reviewed.  Patient reports nausea and vomiting.  HISTORY: OB History  Gravida Para Term Preterm AB Living  1 0 0 0 0 0  SAB IAB Ectopic Multiple Live Births  0 0 0 0 0    # Outcome Date GA Lbr Len/2nd Weight Sex Type Anes PTL Lv  1 Current             Last pap smear was done 2022 and was normal  Past Medical History:  Diagnosis Date   Depression with anxiety 07/08/2022   Marijuana use daily 06/01/2020   Migraine headache    age 76   Nonintractable headache 07/30/2022   Scoliosis    Seasonal allergic rhinitis due to pollen 07/30/2022   Past Surgical History:  Procedure Laterality Date   NO PAST SURGERIES     Family History  Problem Relation Age of Onset   Asthma Mother    Heart disease Father    Mental illness Father    Bipolar disorder Father    Anxiety disorder Father    Depression Father    Post-traumatic stress disorder Father    Asthma Sister    Thyroid  disease Maternal Grandmother    Diabetes Maternal Grandfather    Heart disease Maternal Grandfather    Hypertension Maternal Grandfather    Stroke Maternal Grandfather    Breast cancer Paternal Grandmother    Hearing loss Neg Hx    Social History   Tobacco Use   Smoking status: Every Day    Current packs/day: 0.25    Average packs/day: 0.3 packs/day for 11.0 years (2.8 ttl pk-yrs)    Types: Cigarettes   Smokeless tobacco: Never   Tobacco comments:    Smokes approx 3 cig/day since found out preg. Denies vaping. Daily mj use/ trying to decrease use. Patient counseled.Amadeo Pines, RN  Vaping Use   Vaping status: Never Used  Substance Use Topics   Alcohol use: Not Currently     Comment: last weekend 06/11/24  beer / counseled   Drug use: Yes    Frequency: 7.0 times per week    Types: Marijuana    Comment: daily mj use/ counseled   No Known Allergies Current Outpatient Medications on File Prior to Visit  Medication Sig Dispense Refill   Prenatal Vit-Fe Fumarate-FA (MULTIVITAMIN-PRENATAL) 27-0.8 MG TABS tablet Take 1 tablet by mouth daily at 12 noon.     escitalopram  (LEXAPRO ) 10 MG tablet Take 1 tablet (10 mg total) by mouth daily. (Patient not taking: Reported on 07/20/2024) 90 tablet 3   mirtazapine (REMERON) 15 MG tablet Take 15 mg by mouth at bedtime. (Patient not taking: Reported on 07/20/2024)     No current facility-administered medications on file prior to visit.    Review of Systems Pertinent items noted in HPI and remainder of comprehensive ROS otherwise negative.  Indications for ASA therapy (per UpToDate) One of the following: (Needs 162 mg daily) Previous pregnancy with preeclampsia, especially early onset and with an adverse outcome No Chronic hypertension No Type 1 or 2 diabetes mellitus No Multifetal gestation No Chronic kidney disease No Autoimmune disease (antiphospholipid syndrome, systemic lupus erythematosus) No Two or more of the following: (Can do  81 mg daily) Nulliparity Yes Obesity (body mass index >30 kg/m2) No Family history of preeclampsia in mother or sister No Age >=35 years No Sociodemographic characteristics (African American race, low socioeconomic level) Yes Personal risk factors (eg, previous pregnancy with low birth weight or small for gestational age infant, previous adverse pregnancy outcome [eg, stillbirth], interval >10 years between pregnancies) No In vitro conception No  Physical Exam:   Vitals:   07/20/24 1007  BP: 119/82  Pulse: 77  Weight: 125 lb (56.7 kg)   Fetal Heart Rate (bpm): 162  Uterine size: N/A  Bedside Ultrasound for FHR check: Viable intrauterine pregnancy with positive cardiac activity  noted, fetal heart rate 162 bpm  General: well-developed, well-nourished female in no acute distress  Breasts:  normal appearance, no masses or tenderness bilaterally, exam done in the presence of a chaperone.   Skin: normal coloration and turgor, no rashes  Neurologic: oriented, normal, negative, normal mood  Extremities: normal strength, tone, and muscle mass, ROM of all joints is normal  HEENT PERRLA, extraocular movement intact and sclera clear, anicteric. Patient has an abnormal growth on her nose where a previous nose ring was in place. Growth is flesh colored, raised, and soft. No drainage noted. Please see image below for reference.  Neck supple and no masses  Cardiovascular: regular rate and rhythm  Respiratory:  no respiratory distress, normal breath sounds  Abdomen: soft, non-tender; bowel sounds normal; no masses,  no organomegaly  Pelvic: normal external genitalia, no lesions, normal vaginal mucosa, normal vaginal discharge, normal cervix, pap smear done. Exam done in the presence of a chaperone.    Assessment:  Pregnancy: G1P0000 Patient Active Problem List   Diagnosis Date Noted   [redacted] weeks gestation of pregnancy 07/20/2024   Encounter for supervision of low-risk pregnancy in first trimester 07/20/2024   [redacted] weeks gestation of pregnancy 07/20/2024   Encounter for supervision of normal first pregnancy in first trimester 06/30/2024   Depression with anxiety 07/08/2022   Smoker 1/2-3/4 ppd 06/01/2020   Marijuana use daily 06/01/2020    Plan:  1. Encounter for supervision of normal first pregnancy in first trimester (Primary) Normal BP and FHR. New OB labs drawn today  - CBC/D/Plt+RPR+Rh+ABO+RubIgG... - Culture, OB Urine - HORIZON CUSTOM - PANORAMA PRENATAL TEST - Cytology - PAP - Hemoglobin A1c  2. [redacted] weeks gestation of pregnancy Follow up in 4 weeks for ROB. Plan for AFP lab to be drawn.  3. Marijuana use during pregnancy Discuss cessation of marijuana use and the  cycle of marijuana dependence and nausea and vomiting in pregnancy.   4. Tobacco use affecting pregnancy, antepartum Discussed tobacco cessation and it risks in pregnancy.  5. Nausea and vomiting during pregnancy Also discussed diet changes and the importance of eating small frequent meals w/ smal snacks in between. Avoid spicy, acidic, and greasy foods as this can exacerbate symptoms.  - ondansetron  (ZOFRAN ) 4 MG tablet; Take 1 tablet (4 mg total) by mouth every 8 (eight) hours as needed for nausea or vomiting.  Dispense: 20 tablet; Refill: 0  6. Situational stress History of depression and anxiety. Reports increased stress due to pregnancy and lack of employment.  - Ambulatory referral to Integrated Behavioral Health  7. Abnormal skin growth See description in PE and attached picture. - Ambulatory referral to Dermatology    Initial labs drawn. Continue prenatal vitamins. Problem list reviewed and updated. Genetic Screening discussed, Panorama and Horizon: requested. Ultrasound discussed; fetal anatomic survey: scheduled. Anticipatory guidance about  prenatal visits given including labs, ultrasounds, and testing. Weight gain recommendations per IOM guidelines reviewed: underweight/BMI 18.5 or less > 28 - 40 lbs; normal weight/BMI 18.5 - 24.9 > 25 - 35 lbs; overweight/BMI 25 - 29.9 > 15 - 25 lbs; obese/BMI 30 or more > 11 - 20 lbs. Discussed usage of the Babys[employer]s app for more information about pregnancy, and to track blood pressures. Also discussed usage of virtual visits as additional source of managing and completing prenatal visits.  Patient was encouraged to use MyChart to review results, send requests, and have questions addressed.   The nature of [facility] with multiple MDs and Advanced Practice Providers was explained to patient; also emphasized that residents, students are part of our team. Routine obstetric precautions reviewed.  Encouraged to seek out care at our office or emergency room [facility] preferred) for urgent and/or emergent concerns. Return in about 4 weeks (around 08/17/2024) for LOB.    Harjot Dibello Erven) Emilio, MSN, CNM  [facility]  07/20/2024 12:13 PM

## 2024-07-20 NOTE — Progress Notes (Signed)
 NOB  Genetic Screening: Yes  Pap: 06/13/21 WNL Anatomy scan already scheduled for 09/09/24.  CC: Nausea and vomiting everyday would like to discuss medication management. Notes THC makes her feel better and able to eat.  Notes breast tenderness.

## 2024-07-21 LAB — CBC/D/PLT+RPR+RH+ABO+RUBIGG...
Antibody Screen: NEGATIVE
Basophils Absolute: 0 x10E3/uL (ref 0.0–0.2)
Basos: 1 %
EOS (ABSOLUTE): 0.1 x10E3/uL (ref 0.0–0.4)
Eos: 1 %
HCV Ab: NONREACTIVE
HIV Screen 4th Generation wRfx: NONREACTIVE
Hematocrit: 38.8 % (ref 34.0–46.6)
Hemoglobin: 12.5 g/dL (ref 11.1–15.9)
Hepatitis B Surface Ag: NEGATIVE
Immature Grans (Abs): 0 x10E3/uL (ref 0.0–0.1)
Immature Granulocytes: 0 %
Lymphocytes Absolute: 2.5 x10E3/uL (ref 0.7–3.1)
Lymphs: 30 %
MCH: 30.2 pg (ref 26.6–33.0)
MCHC: 32.2 g/dL (ref 31.5–35.7)
MCV: 94 fL (ref 79–97)
Monocytes Absolute: 0.4 x10E3/uL (ref 0.1–0.9)
Monocytes: 4 %
Neutrophils Absolute: 5.1 x10E3/uL (ref 1.4–7.0)
Neutrophils: 64 %
Platelets: 385 x10E3/uL (ref 150–450)
RBC: 4.14 x10E6/uL (ref 3.77–5.28)
RDW: 12.8 % (ref 11.7–15.4)
RPR Ser Ql: NONREACTIVE
Rh Factor: POSITIVE
Rubella Antibodies, IGG: 5.41 {index} (ref >0.99)
WBC: 8.1 x10E3/uL (ref 3.4–10.8)

## 2024-07-21 LAB — HEMOGLOBIN A1C
Est. average glucose Bld gHb Est-mCnc: 114 mg/dL
Hgb A1c MFr Bld: 5.6 % (ref 4.8–5.6)

## 2024-07-21 LAB — HCV INTERPRETATION

## 2024-07-23 LAB — URINE CULTURE, OB REFLEX

## 2024-07-23 LAB — CULTURE, OB URINE

## 2024-07-26 ENCOUNTER — Ambulatory Visit: Payer: Self-pay | Admitting: Certified Nurse Midwife

## 2024-07-26 LAB — CYTOLOGY - PAP
Chlamydia: NEGATIVE
Comment: NEGATIVE
Comment: NEGATIVE
Comment: NORMAL
Diagnosis: NEGATIVE
High risk HPV: NEGATIVE
Neisseria Gonorrhea: NEGATIVE

## 2024-07-30 LAB — HORIZON CUSTOM: REPORT SUMMARY: NEGATIVE

## 2024-08-01 LAB — PANORAMA PRENATAL TEST FULL PANEL:PANORAMA TEST PLUS 5 ADDITIONAL MICRODELETIONS: FETAL FRACTION: 15.9

## 2024-08-10 ENCOUNTER — Other Ambulatory Visit: Payer: Self-pay

## 2024-08-10 DIAGNOSIS — Z5321 Procedure and treatment not carried out due to patient leaving prior to being seen by health care provider: Secondary | ICD-10-CM | POA: Insufficient documentation

## 2024-08-10 DIAGNOSIS — Z3A15 15 weeks gestation of pregnancy: Secondary | ICD-10-CM | POA: Diagnosis not present

## 2024-08-10 DIAGNOSIS — R350 Frequency of micturition: Secondary | ICD-10-CM | POA: Diagnosis not present

## 2024-08-10 DIAGNOSIS — O219 Vomiting of pregnancy, unspecified: Secondary | ICD-10-CM | POA: Insufficient documentation

## 2024-08-10 LAB — CBC
HCT: 31.6 % — ABNORMAL LOW (ref 36.0–46.0)
Hemoglobin: 10.6 g/dL — ABNORMAL LOW (ref 12.0–15.0)
MCH: 30 pg (ref 26.0–34.0)
MCHC: 33.5 g/dL (ref 30.0–36.0)
MCV: 89.5 fL (ref 80.0–100.0)
Platelets: 331 K/uL (ref 150–400)
RBC: 3.53 MIL/uL — ABNORMAL LOW (ref 3.87–5.11)
RDW: 13.2 % (ref 11.5–15.5)
WBC: 8.2 K/uL (ref 4.0–10.5)
nRBC: 0 % (ref 0.0–0.2)

## 2024-08-10 LAB — URINALYSIS, ROUTINE W REFLEX MICROSCOPIC
Bilirubin Urine: NEGATIVE
Glucose, UA: NEGATIVE mg/dL
Hgb urine dipstick: NEGATIVE
Ketones, ur: NEGATIVE mg/dL
Nitrite: NEGATIVE
Protein, ur: NEGATIVE mg/dL
Specific Gravity, Urine: 1.024 (ref 1.005–1.030)
pH: 6 (ref 5.0–8.0)

## 2024-08-10 LAB — COMPREHENSIVE METABOLIC PANEL WITH GFR
ALT: 10 U/L (ref 0–44)
AST: 17 U/L (ref 15–41)
Albumin: 3.5 g/dL (ref 3.5–5.0)
Alkaline Phosphatase: 35 U/L — ABNORMAL LOW (ref 38–126)
Anion gap: 8 (ref 5–15)
BUN: 9 mg/dL (ref 6–20)
CO2: 22 mmol/L (ref 22–32)
Calcium: 9.4 mg/dL (ref 8.9–10.3)
Chloride: 106 mmol/L (ref 98–111)
Creatinine, Ser: 0.64 mg/dL (ref 0.44–1.00)
GFR, Estimated: >60 mL/min (ref >60)
Glucose, Bld: 107 mg/dL — ABNORMAL HIGH (ref 70–99)
Potassium: 3.8 mmol/L (ref 3.5–5.1)
Sodium: 136 mmol/L (ref 135–145)
Total Bilirubin: <0.2 mg/dL (ref 0.0–1.2)
Total Protein: 7 g/dL (ref 6.5–8.1)

## 2024-08-10 LAB — LIPASE, BLOOD: Lipase: 33 U/L (ref 11–51)

## 2024-08-10 NOTE — ED Triage Notes (Signed)
 Patient ambulatory to triage with complaints of vomiting x2 days and urinary frequency. Is currently [redacted] weeks pregnant.

## 2024-08-11 ENCOUNTER — Emergency Department
Admission: EM | Admit: 2024-08-11 | Discharge: 2024-08-11 | Discharge status: Against Medical Advice | Attending: Emergency Medicine | Admitting: Emergency Medicine

## 2024-08-12 LAB — URINE CULTURE

## 2024-08-15 ENCOUNTER — Encounter: Payer: Self-pay | Admitting: Certified Nurse Midwife

## 2024-08-15 MED ORDER — CEFADROXIL 500 MG PO CAPS: 500.0000 mg | ORAL_CAPSULE | Freq: Two times a day (BID) | ORAL | 0 refills | Status: DC

## 2024-08-17 ENCOUNTER — Telehealth: Payer: Self-pay

## 2024-08-17 ENCOUNTER — Encounter: Admitting: Obstetrics & Gynecology

## 2024-08-17 NOTE — Telephone Encounter (Signed)
 Left message for pt to call office back regarding missed ROB appt today.

## 2024-08-29 ENCOUNTER — Encounter: Admitting: Obstetrics and Gynecology

## 2024-08-29 ENCOUNTER — Telehealth: Payer: Self-pay

## 2024-08-29 NOTE — Telephone Encounter (Signed)
 Left message for pt to call office back regarding missed ROB appt today.

## 2024-09-05 ENCOUNTER — Other Ambulatory Visit: Payer: Self-pay | Admitting: *Deleted

## 2024-09-05 MED ORDER — CEFADROXIL 500 MG PO CAPS: 500.0000 mg | ORAL_CAPSULE | Freq: Two times a day (BID) | ORAL | 0 refills | Status: AC

## 2024-09-05 NOTE — Progress Notes (Signed)
 Pt never picked up RX from [facility] in [place], requesting [facility] to [facility] [place]

## 2024-09-09 ENCOUNTER — Ambulatory Visit

## 2024-09-09 ENCOUNTER — Other Ambulatory Visit: Payer: Self-pay | Admitting: *Deleted

## 2024-09-09 ENCOUNTER — Ambulatory Visit: Attending: Family Medicine | Admitting: Maternal & Fetal Medicine

## 2024-09-09 ENCOUNTER — Other Ambulatory Visit: Payer: Self-pay | Admitting: Family Medicine

## 2024-09-09 DIAGNOSIS — O358XX0 Maternal care for other (suspected) fetal abnormality and damage, not applicable or unspecified: Secondary | ICD-10-CM

## 2024-09-09 DIAGNOSIS — F1290 Cannabis use, unspecified, uncomplicated: Secondary | ICD-10-CM | POA: Diagnosis not present

## 2024-09-09 DIAGNOSIS — Z3401 Encounter for supervision of normal first pregnancy, first trimester: Secondary | ICD-10-CM

## 2024-09-09 DIAGNOSIS — Z363 Encounter for antenatal screening for malformations: Secondary | ICD-10-CM | POA: Insufficient documentation

## 2024-09-09 DIAGNOSIS — O3680X0 Pregnancy with inconclusive fetal viability, not applicable or unspecified: Secondary | ICD-10-CM

## 2024-09-09 DIAGNOSIS — O99322 Drug use complicating pregnancy, second trimester: Secondary | ICD-10-CM | POA: Diagnosis not present

## 2024-09-09 DIAGNOSIS — O99323 Drug use complicating pregnancy, third trimester: Secondary | ICD-10-CM

## 2024-09-09 DIAGNOSIS — Z3A18 18 weeks gestation of pregnancy: Secondary | ICD-10-CM | POA: Diagnosis not present

## 2024-09-09 DIAGNOSIS — O99332 Smoking (tobacco) complicating pregnancy, second trimester: Secondary | ICD-10-CM

## 2024-09-09 DIAGNOSIS — F17210 Nicotine dependence, cigarettes, uncomplicated: Secondary | ICD-10-CM | POA: Diagnosis not present

## 2024-09-09 DIAGNOSIS — F1910 Other psychoactive substance abuse, uncomplicated: Secondary | ICD-10-CM | POA: Diagnosis not present

## 2024-09-09 DIAGNOSIS — O99320 Drug use complicating pregnancy, unspecified trimester: Secondary | ICD-10-CM | POA: Insufficient documentation

## 2024-09-09 DIAGNOSIS — F17200 Nicotine dependence, unspecified, uncomplicated: Secondary | ICD-10-CM | POA: Diagnosis not present

## 2024-09-09 NOTE — Progress Notes (Addendum)
 Patient information  Patient Name: Susan Conrad  Patient MRN:   [id number]  Referring practice: MFM Referring Provider: [facility]  Problem List   Patient Active Problem List   Diagnosis Date Noted   Marijuana use during pregnancy 09/09/2024   Encounter for supervision of low-risk pregnancy in first trimester 07/20/2024   Supervision of pregnancy with other poor reproductive or obstetric history, second trimester 06/30/2024   Depression with anxiety 07/08/2022   Tobacco use in pregnancy, antepartum, second trimester 06/01/2020    Maternal Fetal Medicine Consult [facility] M Susan Conrad is a 28 y.o. G1P0000 at [redacted]w[redacted]d here for ultrasound and consultation. She had low risk aneuploidy screening of a female fetus. Carrier screening was Negative for the basic screening (SMA, alpha-thal, beta-thal, and cystic fibroisis. Maternal serum AFP n/a. She has no acute concerns.   Today we focused on the following:   The patient is doing well today without complaint.  I discussed that her due date should be changed since she had a 9w ultrasound that was greater than 5 days difference from her LMP.  This puts the fetal biometry at the normal range.  I discussed the potential indications to return for future ultrasounds.  Since the patient is currently smoking tobacco products and marijuana she will return for growth ultrasounds.  I discussed the importance of minimizing and eliminating exposure during pregnancy if possible.  We also discussed the possibility of nicotine replacement and she will discuss this with her OB provider.  Sonographic findings Single intrauterine pregnancy at 18w 3d. Fetal cardiac activity:  Observed and appears normal. Presentation: Breech. The anatomic structures that were well seen appear normal without evidence of soft markers. The anatomic survey is complete.  Fetal biometry shows the estimated fetal weight at the 57 percentile. Amniotic fluid: Within normal  limits.  MVP: 4.48 cm. Placenta: Anterior. Adnexa: No abnormality visualized. Cervical length: 3.1 cm.  There are limitations of prenatal ultrasound such as the inability to detect certain abnormalities due to poor visualization. Various factors such as fetal position, gestational age and maternal body habitus may increase the difficulty in visualizing the fetal anatomy.    Recommendations -EDD should be 02/07/2025 based on  U/S C R L  (06/30/24). - Serial growth ultrasounds to be done due to tobacco marijuana exposure -Encouraged the patient to consider nicotine replacement and tobacco cessation Review of Systems: A review of systems was performed and was negative except per HPI   Past Obstetrical History:  OB History  Gravida Para Term Preterm AB Living  1 0 0 0 0 0  SAB IAB Ectopic Multiple Live Births  0 0 0 0 0    # Outcome Date GA Lbr Len/2nd Weight Sex Type Anes PTL Lv  1 Current              Past Medical History:  Past Medical History:  Diagnosis Date   Depression with anxiety 07/08/2022   Marijuana use daily 06/01/2020   Migraine headache    age 68   Nonintractable headache 07/30/2022   Scoliosis    Seasonal allergic rhinitis due to pollen 07/30/2022     Past Surgical History:    Past Surgical History:  Procedure Laterality Date   NO PAST SURGERIES       Home Medications:   Current Outpatient Medications on File Prior to Visit  Medication Sig Dispense Refill   cefadroxil  (DURICEF) 500 MG capsule Take 1 capsule (500 mg total) by mouth 2 (  two) times daily for 7 days. 14 capsule 0   escitalopram  (LEXAPRO ) 10 MG tablet Take 1 tablet (10 mg total) by mouth daily. (Patient not taking: Reported on 07/20/2024) 90 tablet 3   mirtazapine (REMERON) 15 MG tablet Take 15 mg by mouth at bedtime. (Patient not taking: Reported on 07/20/2024)     ondansetron  (ZOFRAN ) 4 MG tablet Take 1 tablet (4 mg total) by mouth every 8 (eight) hours as needed for nausea or vomiting. 20  tablet 0   Prenatal Vit-Fe Fumarate-FA (MULTIVITAMIN-PRENATAL) 27-0.8 MG TABS tablet Take 1 tablet by mouth daily at 12 noon.     No current facility-administered medications on file prior to visit.      Allergies:   No Known Allergies   Physical Exam:   There were no vitals filed for this visit. Sitting comfortably on the sonogram table Nonlabored breathing Normal rate and rhythm Abdomen is nontender  Thank you for the opportunity to be involved with this patient's care. Please let us  know if we can be of any further assistance.   45 minutes of time was spent reviewing the patient's chart including labs, imaging and documentation.  At least 50% of this time was spent with direct patient care discussing the diagnosis, management and prognosis of her care.  Delora Smaller MFM, [facility]   09/09/2024  4:45 PM

## 2024-09-11 ENCOUNTER — Ambulatory Visit: Payer: Self-pay | Admitting: Family Medicine

## 2024-09-11 DIAGNOSIS — O09292 Supervision of pregnancy with other poor reproductive or obstetric history, second trimester: Secondary | ICD-10-CM

## 2024-09-14 ENCOUNTER — Encounter: Admitting: Obstetrics and Gynecology

## 2024-09-14 ENCOUNTER — Telehealth: Payer: Self-pay | Admitting: Obstetrics and Gynecology

## 2024-09-14 NOTE — Telephone Encounter (Signed)
 Patient was contacted to reschedule missed appt. Left voicemail.

## 2024-09-16 ENCOUNTER — Telehealth: Payer: Self-pay

## 2024-09-16 ENCOUNTER — Telehealth (INDEPENDENT_AMBULATORY_CARE_PROVIDER_SITE_OTHER): Admitting: Family Medicine

## 2024-09-16 DIAGNOSIS — F17200 Nicotine dependence, unspecified, uncomplicated: Secondary | ICD-10-CM | POA: Diagnosis not present

## 2024-09-16 DIAGNOSIS — Z3A19 19 weeks gestation of pregnancy: Secondary | ICD-10-CM

## 2024-09-16 DIAGNOSIS — O09292 Supervision of pregnancy with other poor reproductive or obstetric history, second trimester: Secondary | ICD-10-CM | POA: Diagnosis not present

## 2024-09-16 DIAGNOSIS — O99332 Smoking (tobacco) complicating pregnancy, second trimester: Secondary | ICD-10-CM

## 2024-09-16 NOTE — Telephone Encounter (Signed)
 Left message for pt to call office back to get scheduled for upcoming ROB appts

## 2024-09-16 NOTE — Progress Notes (Signed)
 Virtual OB Visit.

## 2024-09-16 NOTE — Progress Notes (Signed)
 OBSTETRICS PRENATAL VIRTUAL VISIT ENCOUNTER NOTE  Provider location: Center for [facility]   Patient location: Home  I connected with Susan Conrad on 09/16/24 at  8:55 AM EDT by MyChart Video Encounter and verified that I am speaking with the correct person using two identifiers. I discussed the limitations, risks, security and privacy concerns of performing an evaluation and management service virtually and the availability of in person appointments. I also discussed with the patient that there may be a patient responsible charge related to this service. The patient expressed understanding and agreed to proceed. Subjective:  Susan Conrad is a 28 y.o. G1P0000 at [redacted]w[redacted]d being seen today for ongoing prenatal care.  She is currently monitored for the following issues for this low-risk pregnancy and has Depression with anxiety; Supervision of pregnancy with other poor reproductive or obstetric history, second trimester; Encounter for supervision of low-risk pregnancy in first trimester; Tobacco use in pregnancy, antepartum, second trimester; and Marijuana use during pregnancy on their problem list.  Patient reports no complaints.   .  .   . Denies any leaking of fluid.   The following portions of the patient's history were reviewed and updated as appropriate: allergies, current medications, past family history, past medical history, past social history, past surgical history and problem list.   Objective:    There were no vitals filed for this visit.  Fetal Status:           General: Alert, oriented and cooperative. Patient is in no acute distress.  Respiratory: Normal respiratory effort, no problems with respiration noted  Mental Status: Normal mood and affect. Normal behavior. Normal judgment and thought content.  Rest of physical exam deferred due to type of encounter  Imaging: US  MFM OB DETAIL +14 WK Result Date:  09/09/2024 ----------------------------------------------------------------------  OBSTETRICS REPORT                    (Corrected Final 09/09/2024 04:54 pm) ---------------------------------------------------------------------- Patient Info  ID #:       [id number]                          D.O.B.:  1996-04-12 (28 yrs)(F)  Name:       Susan Conrad                 Visit Date: 09/09/2024 01:51 pm ---------------------------------------------------------------------- Performed By  Attending:        Delora Smaller DO       Ref. Address:     [address]                                                             [address]  Performed By:     Elenor Edu BS      [facility]  Referred By:      [facility] ---------------------------------------------------------------------- Orders  #  Description                           Code        Ordered By  1  US  MFM OB DETAIL +14 WK               76811.01    GLENYS BIRK ----------------------------------------------------------------------  #  Order #                     Accession #                Episode #  1  [id number]                   [id number]                 [id number] ---------------------------------------------------------------------- Indications  [redacted] weeks gestation of pregnancy                Z3A.18  Antenatal screening for malformations          Z36.3  Low risk NIPS, neg Horizon  Tobacco use complicating pregnancy,            O99.332  second trimester  Drug use (THC) complicating pregnancy,         O99.322  second trimester ---------------------------------------------------------------------- Fetal Evaluation  Num Of Fetuses:         1  Fetal Heart Rate(bpm):  122  Cardiac Activity:       Observed  Presentation:           Breech   Placenta:               Anterior  P. Cord Insertion:      Visualized  Amniotic Fluid  AFI FV:      Within normal limits                              Largest Pocket(cm)                              4.48 ---------------------------------------------------------------------- Biometry  BPD:      41.8  mm     G. Age:  18w 5d         61  %    CI:        73.35   %    70 - 86                                                          FL/HC:      17.9   %    15.8 - 18  HC:      155.1  mm  G. Age:  18w 3d         44  %    HC/AC:      1.17        1.07 - 1.29  AC:      132.4  mm     G. Age:  18w 5d         58  %    FL/BPD:     66.5   %  FL:       27.8  mm     G. Age:  18w 4d         47  %    FL/AC:      21.0   %    20 - 24  CER:      19.2  mm     G. Age:  18w 5d         62  %  NFT:       4.1  mm  LV:        6.2  mm  CM:        3.3  mm  Est. FW:     249  gm      0 lb 9 oz     57  % ---------------------------------------------------------------------- OB History  Gravidity:    1         Term:   0        Prem:   0        SAB:   0  TOP:          0       Ectopic:  0        Living: 0 ---------------------------------------------------------------------- Gestational Age  LMP:           19w 2d        Date:  04/27/24                   EDD:   02/01/25  U/S Today:     18w 4d                                        EDD:   02/06/25  Best:          18w 3d     Det. By:  U/S C R L  (06/30/24)    EDD:   02/07/25 ---------------------------------------------------------------------- Targeted Anatomy  Central Nervous System  Calvarium/Cranial V.:  Appears normal         Cereb./Vermis:          Appears normal  Cavum:                 Appears normal         Cisterna Magna:         Appears normal  Lateral Ventricles:    Appears normal         Midline Falx:           Appears normal  Choroid Plexus:        Appears normal  Spine  Cervical:              Appears normal         Sacral:                 Appears normal  Thoracic:  Appears normal          Shape/Curvature:        Appears normal  Lumbar:                Appears normal  Head/Neck  Lips:                  Appears normal         Profile:                Appears normal  Neck:                  Appears normal         Orbits/Eyes:            Appears normal  Nuchal Fold:           Appears normal         Mandible:               Appears normal  Nasal Bone:            Present                Maxilla:                Appears normal  Thorax  4 Chamber View:        Appears normal         Interventr. Septum:     Appears normal  Cardiac Rhythm:        Normal                 Cardiac Axis:           Normal  Cardiac Situs:         Appears normal         Diaphragm:              Appears normal  Rt Outflow Tract:      Appears normal         3 Vessel View:          Appears normal  Lt Outflow Tract:      Appears normal         3 V Trachea View:       Appears normal  Aortic Arch:           Appears normal         IVC:                    Appears normal  Ductal Arch:           Appears normal         Crossing:               Appears normal  SVC:                   Appears normal  Abdomen  Ventral Wall:          Appears normal         Lt Kidney:              Appears normal  Cord Insertion:        Appears normal         Rt Kidney:              Appears normal  Situs:                 Appears normal  Bladder:                Appears normal  Stomach:               Appears normal  Extremities  Lt Humerus:            Appears normal         Lt Femur:               Appears normal  Rt Humerus:            Appears normal         Rt Femur:               Appears normal  Lt Forearm:            Appears normal         Lt Lower Leg:           Appears normal  Rt Forearm:            Appears normal         Rt Lower Leg:           Appears normal  Lt Hand:               Open hand nml          Lt Foot:                Nml heel/foot  Rt Hand:               Open hand nml          Rt Foot:                Nml heel/foot  Other  Umbilical Cord:        Normal  3-vessel        Genitalia:              Female-nml  Comment:     Fetal anatomic survey complete. ---------------------------------------------------------------------- Cervix Uterus Adnexa  Cervix  Length:            3.1  cm.  Normal appearance by transabdominal scan  Uterus  No abnormality visualized.  Right Ovary  Within normal limits.  Left Ovary  Not visualized.  Cul De Sac  No free fluid seen.  Adnexa  No abnormality visualized ---------------------------------------------------------------------- Comments  Maternal Fetal Medicine Consult  [facility] M Pilant is a 28 y.o. G1P0000 at [redacted]w[redacted]d here for  ultrasound and consultation. She had low risk aneuploidy  screening of a female fetus. Carrier screening was Negative  for the basic screening (SMA, alpha-thal, beta-thal, and cystic  fibroisis. Maternal serum AFP n/a. She has no acute  concerns.  Today we focused on the following:  The patient is doing well today without complaint.  I discussed  that her due date should be changed since she had a 9w  ultrasound that was greater than 5 days difference from her  LMP.  This puts the fetal biometry at the normal range.  I  discussed the potential indications to return for future  ultrasounds.  Since the patient is currently smoking tobacco  products and marijuana she will return for growth  ultrasounds.  I discussed the importance of minimizing and  eliminating exposure during pregnancy if possible.  We also  discussed the possibility of nicotine replacement and she will  discuss this with her OB provider.  Sonographic findings  Single intrauterine pregnancy at 18w 3d.  Fetal  cardiac activity:  Observed and appears normal.  Presentation: Breech.  The anatomic structures that were well seen appear normal  without evidence of soft markers. The anatomic survey is  complete.  Fetal biometry shows the estimated fetal weight at the 57  percentile.  Amniotic fluid: Within normal limits.  MVP: 4.48 cm.  Placenta: Anterior.  Adnexa:  No abnormality visualized.  Cervical length: 3.1 cm.  There are limitations of prenatal ultrasound such as the  inability to detect certain abnormalities due to poor  visualization. Various factors such as fetal position,  gestational age and maternal body habitus may increase the  difficulty in visualizing the fetal anatomy.  Recommendations  -EDD should be 02/07/2025 based on  U/S C R L  (06/30/24).  - Serial growth ultrasounds to be done due to tobacco  marijuana exposure  -Encouraged the patient to consider nicotine replacement  and tobacco cessation  Review of Systems: A review of systems was performed and  was negative except per HPI ----------------------------------------------------------------------                       Delora Smaller, DO Electronically Signed Corrected Final Report  09/09/2024 04:54 pm ----------------------------------------------------------------------    Assessment and Plan:  Pregnancy: G1P0000 at [redacted]w[redacted]d 1. Supervision of pregnancy with other poor reproductive or obstetric history, second trimester (Primary) Continue prenatal care.  2. Tobacco use in pregnancy, antepartum, second trimester   3. [redacted] weeks gestation of pregnancy   Preterm labor symptoms and general obstetric precautions including but not limited to vaginal bleeding, contractions, leaking of fluid and fetal movement were reviewed in detail with the patient. I discussed the assessment and treatment plan with the patient. The patient was provided an opportunity to ask questions and all were answered. The patient agreed with the plan and demonstrated an understanding of the instructions. The patient was advised to call back or seek an in-person office evaluation/go to [facility] at [facility] for any urgent or concerning symptoms. Please refer to After Visit Summary for other counseling recommendations.   I provided 7 minutes of face-to-face time during this encounter.  Return in 4 weeks (on  10/14/2024).  Future Appointments  Date Time Provider Department Center  10/14/2024 10:55 AM Eldonna Suzen Octave, MD [facility]  10/20/2024  1:15 PM [facility] PROVIDER 1 [facility]  10/20/2024  1:30 PM [facility] US1 [facility]  11/11/2024  8:15 AM [facility] LAB [facility]  11/11/2024  8:35 AM Izell Harari, MD [facility]  12/01/2024  1:15 PM [facility] PROVIDER 1 [facility]  12/01/2024  1:30 PM [facility] US1 [facility]  01/09/2025 10:30 AM Sandridge, Erminio POUR, PA-C [facility] None    Glenys GORMAN Birk, MD Center for [facility]

## 2024-10-14 ENCOUNTER — Encounter: Payer: Self-pay | Admitting: Family Medicine

## 2024-10-14 ENCOUNTER — Telehealth: Admitting: Family Medicine

## 2024-10-14 DIAGNOSIS — Z3491 Encounter for supervision of normal pregnancy, unspecified, first trimester: Secondary | ICD-10-CM

## 2024-10-14 DIAGNOSIS — F1290 Cannabis use, unspecified, uncomplicated: Secondary | ICD-10-CM

## 2024-10-14 NOTE — Progress Notes (Signed)
 Attempted to connect for virtual visit.   Sent direct link at ~11:20 Called patient mobile-- no answer/phone off Called home # and spoke to Briant Dimes, patient's mother. Patient is not at the location. Mother will direct her to call the office.   Unable to reach patient. Waited in virtual for > 30 minutes.   Suzen Maryan Masters, MD

## 2024-10-20 ENCOUNTER — Ambulatory Visit

## 2024-10-20 DIAGNOSIS — O09292 Supervision of pregnancy with other poor reproductive or obstetric history, second trimester: Secondary | ICD-10-CM

## 2024-10-26 ENCOUNTER — Ambulatory Visit

## 2024-10-26 DIAGNOSIS — O09292 Supervision of pregnancy with other poor reproductive or obstetric history, second trimester: Secondary | ICD-10-CM

## 2024-10-31 ENCOUNTER — Ambulatory Visit

## 2024-10-31 ENCOUNTER — Other Ambulatory Visit: Payer: Self-pay

## 2024-10-31 ENCOUNTER — Ambulatory Visit: Attending: Maternal & Fetal Medicine

## 2024-10-31 VITALS — BP 126/71 | HR 85

## 2024-10-31 DIAGNOSIS — Z3A29 29 weeks gestation of pregnancy: Secondary | ICD-10-CM

## 2024-10-31 DIAGNOSIS — O99332 Smoking (tobacco) complicating pregnancy, second trimester: Secondary | ICD-10-CM | POA: Insufficient documentation

## 2024-10-31 DIAGNOSIS — O99322 Drug use complicating pregnancy, second trimester: Secondary | ICD-10-CM | POA: Diagnosis not present

## 2024-10-31 DIAGNOSIS — Z3A25 25 weeks gestation of pregnancy: Secondary | ICD-10-CM | POA: Diagnosis not present

## 2024-10-31 DIAGNOSIS — O365930 Maternal care for other known or suspected poor fetal growth, third trimester, not applicable or unspecified: Secondary | ICD-10-CM

## 2024-10-31 DIAGNOSIS — O09292 Supervision of pregnancy with other poor reproductive or obstetric history, second trimester: Secondary | ICD-10-CM

## 2024-10-31 DIAGNOSIS — O358XX0 Maternal care for other (suspected) fetal abnormality and damage, not applicable or unspecified: Secondary | ICD-10-CM | POA: Diagnosis present

## 2024-10-31 DIAGNOSIS — O35EXX0 Maternal care for other (suspected) fetal abnormality and damage, fetal genitourinary anomalies, not applicable or unspecified: Secondary | ICD-10-CM | POA: Diagnosis not present

## 2024-10-31 DIAGNOSIS — O99013 Anemia complicating pregnancy, third trimester: Secondary | ICD-10-CM

## 2024-10-31 DIAGNOSIS — D573 Sickle-cell trait: Secondary | ICD-10-CM | POA: Diagnosis not present

## 2024-10-31 DIAGNOSIS — Z362 Encounter for other antenatal screening follow-up: Secondary | ICD-10-CM | POA: Diagnosis not present

## 2024-11-11 ENCOUNTER — Ambulatory Visit (INDEPENDENT_AMBULATORY_CARE_PROVIDER_SITE_OTHER): Admitting: Obstetrics and Gynecology

## 2024-11-11 ENCOUNTER — Other Ambulatory Visit

## 2024-11-11 VITALS — BP 105/71 | HR 84 | Wt 136.4 lb

## 2024-11-11 DIAGNOSIS — O365921 Maternal care for other known or suspected poor fetal growth, second trimester, fetus 1: Secondary | ICD-10-CM

## 2024-11-11 DIAGNOSIS — O99332 Smoking (tobacco) complicating pregnancy, second trimester: Secondary | ICD-10-CM

## 2024-11-11 DIAGNOSIS — Z3A27 27 weeks gestation of pregnancy: Secondary | ICD-10-CM

## 2024-11-11 DIAGNOSIS — K0889 Other specified disorders of teeth and supporting structures: Secondary | ICD-10-CM

## 2024-11-11 DIAGNOSIS — O365930 Maternal care for other known or suspected poor fetal growth, third trimester, not applicable or unspecified: Secondary | ICD-10-CM | POA: Insufficient documentation

## 2024-11-11 DIAGNOSIS — J069 Acute upper respiratory infection, unspecified: Secondary | ICD-10-CM

## 2024-11-11 DIAGNOSIS — Z3491 Encounter for supervision of normal pregnancy, unspecified, first trimester: Secondary | ICD-10-CM

## 2024-11-11 MED ORDER — PRENATAL VITAMIN AND MINERAL 28-0.8 MG PO TABS: 1.0000 | ORAL_TABLET | Freq: Every day | ORAL | 1 refills | Status: DC

## 2024-11-11 MED ORDER — PRENATAL VITAMIN AND MINERAL 28-0.8 MG PO TABS: 1.0000 | ORAL_TABLET | Freq: Every day | ORAL | 1 refills | Status: AC

## 2024-11-11 MED ORDER — NICOTINE 7 MG/24HR TD PT24
7.0000 mg | MEDICATED_PATCH | Freq: Every day | TRANSDERMAL | 1 refills | Status: AC
Start: 2024-12-09 — End: ?

## 2024-11-11 NOTE — Progress Notes (Signed)
 ROB: some swelling and pain in gums, some bleeding in the gums   Every night having to use Orajel, tylenol    Pt stating that she feels like she has a bad cough and some wheezing    Needing prenatal vitamins refilled

## 2024-11-11 NOTE — Progress Notes (Signed)
 PRENATAL VISIT NOTE  Subjective:  Susan Conrad is a 28 y.o. G1P0000 at [redacted]w[redacted]d being seen today for ongoing prenatal care.  She is currently monitored for the following issues for this high-risk pregnancy and has Depression with anxiety; Supervision of pregnancy with other poor reproductive or obstetric history, second trimester; Encounter for supervision of low-risk pregnancy in first trimester; Tobacco use in pregnancy, antepartum, second trimester; Marijuana use during pregnancy; and Poor fetal growth affecting management of mother in third trimester on their problem list.  Patient reports see below.  Contractions: Not present. Vag. Bleeding: None.  Movement: Present. Denies leaking of fluid.   The following portions of the patient's history were reviewed and updated as appropriate: allergies, current medications, past family history, past medical history, past social history, past surgical history and problem list.   Objective:   Vitals:   11/11/24 0907  BP: 105/71  Pulse: 84  Weight: 136 lb 6.4 oz (61.9 kg)    Fetal Status:  Fetal Heart Rate (bpm): 143   Movement: Present    General: Alert, oriented and cooperative. Patient is in no acute distress.  Skin: Skin is warm and dry. No rash noted.   Cardiovascular: Normal heart rate noted. Normal s1 and s2, no MRGs  Respiratory: Normal respiratory effort, no problems with respiration noted. CTAB  Abdomen: Soft, gravid, appropriate for gestational age.  Pain/Pressure: Present     Pelvic: Cervical exam deferred        Extremities: Normal range of motion.  Edema: Trace  Mental Status: Normal mood and affect. Normal behavior. Normal judgment and thought content.      07/20/2024   10:18 AM 06/14/2024   10:17 AM 05/08/2023    3:22 PM  Depression screen PHQ 2/9  Decreased Interest 2 0 3  Down, Depressed, Hopeless 1 0 3  PHQ - 2 Score 3 0 6  Altered sleeping 3  3  Tired, decreased energy 3  3  Change in appetite 0  3  Feeling bad or  failure about yourself  0  3  Trouble concentrating 0  2  Moving slowly or fidgety/restless 0  1  Suicidal thoughts 0  2  PHQ-9 Score 9   23   Difficult doing work/chores Not difficult at all  Very difficult     Data saved with a previous flowsheet row definition        07/20/2024   10:18 AM 05/08/2023    3:23 PM  GAD 7 : Generalized Anxiety Score  Nervous, Anxious, on Edge 1 3  Control/stop worrying 0 3  Worry too much - different things 1 3  Trouble relaxing 1 3  Restless 0 3  Easily annoyed or irritable 1 3  Afraid - awful might happen 0 3  Total GAD 7 Score 4 21  Anxiety Difficulty Not difficult at all Very difficult    Assessment and Plan:  Pregnancy: G1P0000 at [redacted]w[redacted]d 1. Poor fetal growth affecting management of mother in third trimester, single or unspecified fetus (Primary) D/w her re: this, weight gain goals, tobacco cessation 11/10: efw 16%, 782g, ac 33%, afi wnl  2. Tobacco use in pregnancy, antepartum, second trimester D/w her. Pt amenable to trial of patch. D/w her re: can't do patch and smoke  3. Pain, dental Has appt 1st week of December. D/w her re: importance of dental health and ask for earlier appt or UC visit  4. Upper respiratory tract infection, unspecified type No more sinus s/s but still  with cough with clear phlegm. D/w her re: tobacco cessation. Normal heart and lung exam. Recommend expectant management  Preterm labor symptoms and general obstetric precautions including but not limited to vaginal bleeding, contractions, leaking of fluid and fetal movement were reviewed in detail with the patient. Please refer to After Visit Summary for other counseling recommendations.   No follow-ups on file.  Future Appointments  Date Time Provider Department Center  11/21/2024  2:00 PM [facility] US1 [facility]  01/09/2025 10:30 AM Sandridge, Erminio POUR, PA-C [facility] None    Bebe Furry, MD

## 2024-11-12 LAB — CBC
Hematocrit: 33.4 % — ABNORMAL LOW (ref 34.0–46.6)
Hemoglobin: 10.5 g/dL — ABNORMAL LOW (ref 11.1–15.9)
MCH: 30 pg (ref 26.6–33.0)
MCHC: 31.4 g/dL — ABNORMAL LOW (ref 31.5–35.7)
MCV: 95 fL (ref 79–97)
Platelets: 363 x10E3/uL (ref 150–450)
RBC: 3.5 x10E6/uL — ABNORMAL LOW (ref 3.77–5.28)
RDW: 13.3 % (ref 11.7–15.4)
WBC: 11.9 x10E3/uL — ABNORMAL HIGH (ref 3.4–10.8)

## 2024-11-12 LAB — SYPHILIS: RPR W/REFLEX TO RPR TITER AND TREPONEMAL ANTIBODIES, TRADITIONAL SCREENING AND DIAGNOSIS ALGORITHM: RPR Ser Ql: NONREACTIVE

## 2024-11-12 LAB — GLUCOSE TOLERANCE, 2 HOURS W/ 1HR
Glucose, 1 hour: 117 mg/dL (ref 70–179)
Glucose, 2 hour: 116 mg/dL (ref 70–152)
Glucose, Fasting: 90 mg/dL (ref 70–91)

## 2024-11-12 LAB — HIV ANTIBODY (ROUTINE TESTING W REFLEX): HIV Screen 4th Generation wRfx: NONREACTIVE

## 2024-11-13 ENCOUNTER — Ambulatory Visit: Payer: Self-pay | Admitting: Family Medicine

## 2024-11-13 DIAGNOSIS — O99013 Anemia complicating pregnancy, third trimester: Secondary | ICD-10-CM | POA: Insufficient documentation

## 2024-11-13 MED ORDER — FERROUS FUMARATE 325 (106 FE) MG PO TABS: 1.0000 | ORAL_TABLET | ORAL | 2 refills | Status: AC

## 2024-11-15 ENCOUNTER — Encounter: Payer: Self-pay | Admitting: Emergency Medicine

## 2024-11-15 ENCOUNTER — Other Ambulatory Visit: Payer: Self-pay

## 2024-11-15 ENCOUNTER — Emergency Department
Admission: EM | Admit: 2024-11-15 | Discharge: 2024-11-15 | Disposition: A | Discharge status: Home / Self Care | Attending: Emergency Medicine | Admitting: Emergency Medicine

## 2024-11-15 DIAGNOSIS — K029 Dental caries, unspecified: Secondary | ICD-10-CM | POA: Diagnosis not present

## 2024-11-15 DIAGNOSIS — Z3A28 28 weeks gestation of pregnancy: Secondary | ICD-10-CM | POA: Diagnosis not present

## 2024-11-15 DIAGNOSIS — O99891 Other specified diseases and conditions complicating pregnancy: Secondary | ICD-10-CM | POA: Insufficient documentation

## 2024-11-15 DIAGNOSIS — O26893 Other specified pregnancy related conditions, third trimester: Secondary | ICD-10-CM | POA: Diagnosis present

## 2024-11-15 MED ORDER — PENICILLIN V POTASSIUM 250 MG PO TABS
500.0000 mg | ORAL_TABLET | Freq: Once | ORAL | Status: AC
  Administered 2024-11-15: 500 mg via ORAL
  Filled 2024-11-15: qty 2

## 2024-11-15 MED ORDER — OXYCODONE HCL 5 MG PO TABS: 5.0000 mg | ORAL_TABLET | Freq: Three times a day (TID) | ORAL | 0 refills | Status: AC | PRN

## 2024-11-15 MED ORDER — ACETAMINOPHEN 500 MG PO TABS: 1000.0000 mg | ORAL_TABLET | Freq: Once | ORAL | Status: DC

## 2024-11-15 MED ORDER — ACETAMINOPHEN 325 MG PO TABS
650.0000 mg | ORAL_TABLET | Freq: Four times a day (QID) | ORAL | Status: AC | PRN
  Administered 2024-11-15: 650 mg via ORAL
  Filled 2024-11-15: qty 2

## 2024-11-15 MED ORDER — ONDANSETRON 4 MG PO TBDP: 4.0000 mg | ORAL_TABLET | Freq: Four times a day (QID) | ORAL | 0 refills | Status: AC | PRN

## 2024-11-15 NOTE — ED Triage Notes (Signed)
 Pt arrives POV ambulatory to triage, gait steady, no acute distress noted c/o dental pain and upper/lower gum pain x 2 weeks.pt states pain is intermittent and generalized. Pt took tylenol  yesterday w/out relief.

## 2024-11-15 NOTE — ED Provider Notes (Signed)
 [facility] Provider Note    Event Date/Time   First MD Initiated Contact with Patient 11/15/24 0502     (approximate)   History   Dental Pain   HPI  Susan Conrad is a 28 y.o. female G1, P0 approximately [redacted] weeks pregnant with history of depression, anxiety, migraines who presents to the emergency department with diffuse dental pain ongoing for several days.  She has a follow-up appointment with her dentist scheduled on December 11 but states that she has not been able to sleep due to the pain.  She is taking Tylenol  without relief.  No fevers, lip or tongue swelling, difficulty swallowing, speaking or breathing.  She thinks that she needs to be on antibiotics.  Patient denies any abdominal pain, contractions vaginal bleeding, leaking fluid or discharge.  She is feeling baby move.   History provided by patient.    Past Medical History:  Diagnosis Date   Depression with anxiety 07/08/2022   Marijuana use daily 06/01/2020   Migraine headache    age 28   Nonintractable headache 07/30/2022   Scoliosis    Seasonal allergic rhinitis due to pollen 07/30/2022    Past Surgical History:  Procedure Laterality Date   NO PAST SURGERIES      MEDICATIONS:  Prior to Admission medications   Medication Sig Start Date End Date Taking? Authorizing Provider  escitalopram  (LEXAPRO ) 10 MG tablet Take 1 tablet (10 mg total) by mouth daily. Patient not taking: Reported on 11/11/2024 05/08/23   Dugal, Tabitha, FNP  ferrous fumarate  (HEMOCYTE - 106 MG FE) 325 (106 Fe) MG TABS tablet Take 1 tablet (106 mg of iron total) by mouth every other day. 11/13/24   Eldonna Suzen Octave, MD  mirtazapine (REMERON) 15 MG tablet Take 15 mg by mouth at bedtime. Patient not taking: Reported on 11/11/2024 06/10/23   [provider]  nicotine  (NICODERM CQ  - DOSED IN MG/24 HR) 7 mg/24hr patch Place 1 patch (7 mg total) onto the skin daily. 12/09/24   Izell Harari, MD   ondansetron  (ZOFRAN ) 4 MG tablet Take 1 tablet (4 mg total) by mouth every 8 (eight) hours as needed for nausea or vomiting. Patient not taking: Reported on 11/11/2024 07/20/24   Emilio Delilah CHRISTELLA, CNM  Prenatal Vit-Fe Fumarate-FA (PRENATAL VITAMIN AND MINERAL) 28-0.8 MG TABS Take 1 tablet by mouth daily at 6 (six) AM. 11/11/24   Izell Harari, MD    Physical Exam   Triage Vital Signs: ED Triage Vitals  Encounter Vitals Group     BP 11/15/24 0110 105/75     Girls Systolic BP Percentile --      Girls Diastolic BP Percentile --      Boys Systolic BP Percentile --      Boys Diastolic BP Percentile --      Pulse Rate 11/15/24 0110 87     Resp 11/15/24 0110 18     Temp 11/15/24 0110 98.4 F (36.9 C)     Temp Source 11/15/24 0110 Oral     SpO2 11/15/24 0110 100 %     Weight --      Height --      Head Circumference --      Peak Flow --      Pain Score 11/15/24 0113 10     Pain Loc --      Pain Education --      Exclude from Growth Chart --     Most recent vital signs:  Vitals:   11/15/24 0110 11/15/24 0503  BP: 105/75 108/60  Pulse: 87 70  Resp: 18 16  Temp: 98.4 F (36.9 C) 98.4 F (36.9 C)  SpO2: 100% 100%    CONSTITUTIONAL: Alert, responds appropriately to questions. Well-appearing; well-nourished HEAD: Normocephalic, atraumatic EYES: Conjunctivae clear, pupils appear equal, sclera nonicteric ENT: normal nose; moist mucous membranes; Dental caries present to multiple teeth, no drainable dental abscess noted, gums appear normal without bleeding, no Ludwig's angina, tongue sits flat in the bottom of the mouth, no angioedema, no facial erythema or warmth, no facial swelling, moves neck without difficulty, normal phonation without stridor, trismus or drooling. NECK: Supple, normal ROM CARD: RRR RESP: Normal chest excursion without splinting or tachypnea ABD/GI: Gravid uterus SKIN: Normal color for age and race; warm; no rash on exposed skin NEURO: Moves all  extremities equally, normal speech PSYCH: The patient's mood and manner are appropriate.   ED Results / Procedures / Treatments   LABS: (all labs ordered are listed, but only abnormal results are displayed) Labs Reviewed - No data to display   EKG:   RADIOLOGY: My personal review and interpretation of imaging:    I have personally reviewed all radiology reports.   No results found.   PROCEDURES:  Critical Care performed: No      Procedures    IMPRESSION / MDM / ASSESSMENT AND PLAN / ED COURSE  I reviewed the triage vital signs and the nursing notes.    Patient here with complaints of dental pain in pregnancy.  Using Tylenol  and benzocaine over-the-counter without relief.  The patient is on the cardiac monitor to evaluate for evidence of arrhythmia and/or significant heart rate changes.   DIFFERENTIAL DIAGNOSIS (includes but not limited to):   Dental caries, periapical abscess, no sign of Ludwig's angina, drainable abscess, sialoadenitis, parotitis, facial cellulitis, deep space neck infection, uvulitis, PTA, tonsillitis   Patient's presentation is most consistent with acute complicated illness / injury requiring diagnostic workup.   PLAN: Will start patient on antibiotics and discharge with short course of pain medication.  She has follow-up with dentistry scheduled on December 11.  She is well-appearing here.  No pregnancy complaints today.  We did discuss at length the risk of narcotics in pregnancy.  She verbalized understanding but feels she needs something stronger for home given her pain is poorly controlled at this time.  She did drive herself to the emergency department and would like to drive home.  Given Tylenol  here without much relief.  Will provide with short prescription of narcotic pain medication.   MEDICATIONS GIVEN IN ED: Medications  acetaminophen  (TYLENOL ) tablet 650 mg (650 mg Oral Given 11/15/24 0113)  penicillin  v potassium (VEETID) tablet  500 mg (500 mg Oral Given 11/15/24 0531)     ED COURSE:  At this time, I do not feel there is any life-threatening condition present. I reviewed all nursing notes, vitals, pertinent previous records.  All lab and urine results, EKGs, imaging ordered have been independently reviewed and interpreted by myself.  I reviewed all available radiology reports from any imaging ordered this visit.  Based on my assessment, I feel the patient is safe to be discharged home without further emergent workup and can continue workup as an outpatient as needed. Discussed all findings, treatment plan as well as usual and customary return precautions.  They verbalize understanding and are comfortable with this plan.  Outpatient follow-up has been provided as needed.  All questions have been answered.  CONSULTS:  none   OUTSIDE RECORDS REVIEWED: Reviewed recent OB/GYN notes.       FINAL CLINICAL IMPRESSION(S) / ED DIAGNOSES   Final diagnoses:  Pain due to dental caries     Rx / DC Orders   ED Discharge Orders          Ordered    oxyCODONE  (ROXICODONE ) 5 MG immediate release tablet  Every 8 hours PRN        11/15/24 0528    ondansetron  (ZOFRAN -ODT) 4 MG disintegrating tablet  Every 6 hours PRN        11/15/24 0528             Note:  This document was prepared using Dragon voice recognition software and may include unintentional dictation errors.   Mackenzie Groom, Josette SAILOR, DO 11/15/24 [phone]

## 2024-11-15 NOTE — Discharge Instructions (Addendum)
 Please take your antibiotics until complete.  You may take over-the-counter Tylenol  1000 mg every 6 hours as needed for pain.  Please follow-up with your dentist as scheduled on December 11.  You are being provided a prescription for opiates (also known as narcotics) for pain control.  Opiates can be addictive and should only be used when absolutely necessary for pain control when other alternatives do not work.  We recommend you only use them for the recommended amount of time and only as prescribed.  Please do not take with other sedative medications or alcohol.  Please do not drive, operate machinery, make important decisions while taking opiates.  Please note that these medications can be addictive and have high abuse potential.  Patients can become addicted to narcotics after only taking them for a few days.  Please keep these medications locked away from children, teenagers or any family members with history of substance abuse.  Narcotic pain medicine may also make you constipated.  You may use over-the-counter medications such as MiraLAX, Colace to prevent constipation.  If you become constipated, you may use over-the-counter enemas as needed.  Itching and nausea are also common side effects of narcotic pain medication.  If you develop uncontrolled vomiting or a rash, please stop these medications and seek medical care.

## 2024-11-21 ENCOUNTER — Other Ambulatory Visit: Payer: Self-pay

## 2024-11-21 ENCOUNTER — Ambulatory Visit: Admitting: Obstetrics and Gynecology

## 2024-11-21 ENCOUNTER — Ambulatory Visit: Attending: Obstetrics and Gynecology

## 2024-11-21 VITALS — BP 122/73 | HR 96

## 2024-11-21 DIAGNOSIS — O99333 Smoking (tobacco) complicating pregnancy, third trimester: Secondary | ICD-10-CM | POA: Diagnosis present

## 2024-11-21 DIAGNOSIS — O99332 Smoking (tobacco) complicating pregnancy, second trimester: Secondary | ICD-10-CM

## 2024-11-21 DIAGNOSIS — F1210 Cannabis abuse, uncomplicated: Secondary | ICD-10-CM

## 2024-11-21 DIAGNOSIS — F1290 Cannabis use, unspecified, uncomplicated: Secondary | ICD-10-CM

## 2024-11-21 DIAGNOSIS — O99323 Drug use complicating pregnancy, third trimester: Secondary | ICD-10-CM | POA: Diagnosis not present

## 2024-11-21 DIAGNOSIS — Z3A28 28 weeks gestation of pregnancy: Secondary | ICD-10-CM | POA: Diagnosis not present

## 2024-11-21 DIAGNOSIS — Z3689 Encounter for other specified antenatal screening: Secondary | ICD-10-CM | POA: Insufficient documentation

## 2024-11-21 DIAGNOSIS — O09292 Supervision of pregnancy with other poor reproductive or obstetric history, second trimester: Secondary | ICD-10-CM

## 2024-11-21 DIAGNOSIS — O99320 Drug use complicating pregnancy, unspecified trimester: Secondary | ICD-10-CM

## 2024-11-21 DIAGNOSIS — F17210 Nicotine dependence, cigarettes, uncomplicated: Secondary | ICD-10-CM

## 2024-11-21 NOTE — Progress Notes (Signed)
 Maternal-Fetal Medicine Consultation  Name: Susan Conrad  MRN: [id number]  GA: G1P0000 [redacted]w[redacted]d   Patient is here for fetal growth assessment.  She does not have gestational diabetes. Blood pressure today at our office is 122/73 mmHg. Patient reports she smokes 1 pack of cigarettes daily and occasional uses marijuana to improve her appetite. She has a prescription for nicotine  patches.  Ultrasound Normal fetal growth.  The estimated fetal weight is at the 14th percentile.  Normal amniotic fluid.  I reassured the patient on the findings.  Cigarette Smoking in Pregnancy Smoking increases the risk of maternal complications that include placental abruption and placenta previa. Fetal complications include fetal growth restriction, prematurity and stillbirth. Risk of spontaneous abortions is increased.  Smoking cessation at any gestational age is beneficial and should routinely be addressed in her prenatal visits. Nicotine -replacement therapy (NRT) can be considered if nonpharmacological approaches have failed. Its chief benefit is that it does not contain other toxic substances present in cigarettes. Cigarettes, in addition to nicotine  and Carbon monoxide, contain several other toxic substances. I encouraged the patient to take nicotine  patches that may improve quit rates.  Cannabis Marijuana is the most-commonly used recreational drug in pregnancy. About 7% to 10% of pregnant women take cannabis. Some patients use to treat anxiety, depression, nausea and vomiting. A small increase in preterm delivery and neonatal intensive care admissions were reported. No increase in fetal congenital malformations were reported with cannabis. Fetal brain has THC receptor and long-term effects are not known. Concomitant use of tobacco or other drugs increase the risk of pregnancy adverse outcomes.  In consistent with ACOG recommendations, cannabis should be avoided in pregnancy. Patient reports that with  improvement in her appetite, she plans to discontinue marijuana use.  Recommendations Fetal growth assessment in 4 weeks.     Consultation including face-to-face (more than 50%) counseling 30 minutes.

## 2024-11-25 ENCOUNTER — Telehealth: Admitting: Obstetrics and Gynecology

## 2024-11-25 VITALS — BP 104/62 | HR 95

## 2024-11-25 DIAGNOSIS — Z3A29 29 weeks gestation of pregnancy: Secondary | ICD-10-CM | POA: Diagnosis not present

## 2024-11-25 DIAGNOSIS — O99013 Anemia complicating pregnancy, third trimester: Secondary | ICD-10-CM | POA: Diagnosis not present

## 2024-11-25 DIAGNOSIS — O365930 Maternal care for other known or suspected poor fetal growth, third trimester, not applicable or unspecified: Secondary | ICD-10-CM | POA: Diagnosis not present

## 2024-11-25 DIAGNOSIS — D649 Anemia, unspecified: Secondary | ICD-10-CM | POA: Diagnosis not present

## 2024-11-25 NOTE — Progress Notes (Signed)
    TELEHEALTH OBSTETRICS VISIT ENCOUNTER NOTE  Provider location: [facility]   Patient location: Home  I connected with Susan WAFER on 11/25/24 at  9:35 AM EST by telephone at home and verified that I am speaking with the correct person using two identifiers. Of note, unable to do video encounter due to technical difficulties.    I discussed the limitations, risks, security and privacy concerns of performing an evaluation and management service by telephone and the availability of in person appointments. I also discussed with the patient that there may be a patient responsible charge related to this service. The patient expressed understanding and agreed to proceed.  Subjective:  Susan Conrad is a 28 y.o. G1P0000 at [redacted]w[redacted]d being followed for ongoing prenatal care.  She is currently monitored for the following issues for this low-risk pregnancy and has Depression with anxiety; Supervision of pregnancy with other poor reproductive or obstetric history, second trimester; Encounter for supervision of low-risk pregnancy in first trimester; Tobacco use in pregnancy, antepartum, second trimester; Marijuana use during pregnancy; Poor fetal growth affecting management of mother in third trimester; and Anemia affecting pregnancy in third trimester on their problem list.  Patient reports no complaints. Reports fetal movement. Denies any contractions, bleeding or leaking of fluid.   The following portions of the patient's history were reviewed and updated as appropriate: allergies, current medications, past family history, past medical history, past social history, past surgical history and problem list.   Objective:  Blood pressure 104/62, pulse 95, last menstrual period 04/27/2024. General:  Alert, oriented and cooperative.   Mental Status: Normal mood and affect perceived. Normal judgment and thought content.  Rest of physical exam deferred due to type of  encounter  Assessment and Plan:  Pregnancy: G1P0000 at [redacted]w[redacted]d 1. Anemia affecting pregnancy in third trimester (Primary) Confirms on every other day iron  2. Poor fetal growth affecting management of mother in third trimester, single or unspecified fetus Stable efw. Follow up serial scans 12/1: efw 14% (vs 16% on 11/10), 1166g, ac 27% (vs ac 33%), afi 14  3. [redacted] weeks gestation of pregnancy  Preterm labor symptoms and general obstetric precautions including but not limited to vaginal bleeding, contractions, leaking of fluid and fetal movement were reviewed in detail with the patient.  I discussed the assessment and treatment plan with the patient. The patient was provided an opportunity to ask questions and all were answered. The patient agreed with the plan and demonstrated an understanding of the instructions. The patient was advised to call back or seek an in-person office evaluation/go to MAU at [facility] for any urgent or concerning symptoms. Please refer to After Visit Summary for other counseling recommendations.   I provided 7 minutes of non-face-to-face time during this encounter.  No follow-ups on file.  Future Appointments  Date Time Provider Department Center  12/12/2024  1:50 PM Izell Harari, MD [facility]  12/21/2024  2:00 PM [facility]  12/26/2024  1:30 PM Izell Harari, MD [facility]  01/09/2025 10:30 AM Orman Erminio POUR, PA-C [facility] None  01/09/2025  1:30 PM Izell Harari, MD [facility]    Harari Izell, MD [facility] for [facility]

## 2024-12-01 ENCOUNTER — Ambulatory Visit

## 2024-12-02 ENCOUNTER — Other Ambulatory Visit

## 2024-12-12 ENCOUNTER — Telehealth: Admitting: Obstetrics and Gynecology

## 2024-12-12 VITALS — BP 118/59 | HR 81 | Wt 136.0 lb

## 2024-12-12 DIAGNOSIS — O365930 Maternal care for other known or suspected poor fetal growth, third trimester, not applicable or unspecified: Secondary | ICD-10-CM | POA: Diagnosis not present

## 2024-12-12 DIAGNOSIS — O99013 Anemia complicating pregnancy, third trimester: Secondary | ICD-10-CM | POA: Diagnosis not present

## 2024-12-12 DIAGNOSIS — D649 Anemia, unspecified: Secondary | ICD-10-CM | POA: Diagnosis not present

## 2024-12-12 DIAGNOSIS — Z3A31 31 weeks gestation of pregnancy: Secondary | ICD-10-CM | POA: Diagnosis not present

## 2024-12-19 NOTE — Progress Notes (Signed)
" ° ° °  TELEHEALTH OBSTETRICS VISIT ENCOUNTER NOTE  Provider location: [facility]   Patient location: Home  I connected with Susan Conrad on 12/12/2024 at  1:50 PM EST by telephone at home and verified that I am speaking with the correct person using two identifiers. Of note, unable to do video encounter due to technical difficulties.    I discussed the limitations, risks, security and privacy concerns of performing an evaluation and management service by telephone and the availability of in person appointments. I also discussed with the patient that there may be a patient responsible charge related to this service. The patient expressed understanding and agreed to proceed.  Subjective:  Susan Conrad is a 28 y.o. G1P0000 at [redacted]w[redacted]d being followed for ongoing prenatal care.  She is currently monitored for the following issues for this high-risk pregnancy and has Depression with anxiety; Supervision of pregnancy with other poor reproductive or obstetric history, second trimester; Encounter for supervision of low-risk pregnancy in first trimester; Tobacco use in pregnancy, antepartum, second trimester; Marijuana use during pregnancy; Poor fetal growth affecting management of mother in third trimester; and Anemia affecting pregnancy in third trimester on their problem list.  Patient reports no complaints. Reports fetal movement. Denies any contractions, bleeding or leaking of fluid.   The following portions of the patient's history were reviewed and updated as appropriate: allergies, current medications, past family history, past medical history, past social history, past surgical history and problem list.   Objective:  Blood pressure (!) 118/59, pulse 81, weight 136 lb (61.7 kg), last menstrual period 04/27/2024. General:  Alert, oriented and cooperative.   Mental Status: Normal mood and affect perceived. Normal judgment and thought content.  Rest of physical  exam deferred due to type of encounter  Assessment and Plan:  Pregnancy: G1P0000 at [redacted]w[redacted]d 1. [redacted] weeks gestation of pregnancy (Primary) 28wk labs wnl  2. Anemia affecting pregnancy in third trimester Slight anemia with 28wk labs. Pt confirms on po iron   3. Poor fetal growth affecting management of mother in third trimester, single or unspecified fetus 12/1: efw 14%, 116g, ac 27%, afi 14 Follow up serial growth scans  Preterm labor symptoms and general obstetric precautions including but not limited to vaginal bleeding, contractions, leaking of fluid and fetal movement were reviewed in detail with the patient.  I discussed the assessment and treatment plan with the patient. The patient was provided an opportunity to ask questions and all were answered. The patient agreed with the plan and demonstrated an understanding of the instructions. The patient was advised to call back or seek an in-person office evaluation/go to MAU at [facility] for any urgent or concerning symptoms. Please refer to After Visit Summary for other counseling recommendations.   I provided 10 minutes of non-face-to-face time during this encounter.  No follow-ups on file.  Future Appointments  Date Time Provider Department Center  12/21/2024  2:30 PM [facility]  1 [facility] [place]  12/26/2024  1:30 PM Izell Harari, MD [facility]  01/09/2025 10:30 AM Orman Erminio POUR, PA-C [facility] None  01/09/2025  1:30 PM Izell Harari, MD [facility]    Harari Izell, MD Center for [facility]    "

## 2024-12-21 ENCOUNTER — Ambulatory Visit

## 2024-12-21 DIAGNOSIS — O09292 Supervision of pregnancy with other poor reproductive or obstetric history, second trimester: Secondary | ICD-10-CM

## 2024-12-26 ENCOUNTER — Encounter: Admitting: Obstetrics and Gynecology

## 2024-12-26 VITALS — BP 101/64 | HR 80 | Wt 139.0 lb

## 2024-12-26 DIAGNOSIS — Z3A33 33 weeks gestation of pregnancy: Secondary | ICD-10-CM

## 2024-12-26 DIAGNOSIS — O99013 Anemia complicating pregnancy, third trimester: Secondary | ICD-10-CM | POA: Diagnosis not present

## 2024-12-26 DIAGNOSIS — O365930 Maternal care for other known or suspected poor fetal growth, third trimester, not applicable or unspecified: Secondary | ICD-10-CM | POA: Diagnosis not present

## 2024-12-26 NOTE — Progress Notes (Signed)
 "  PRENATAL VISIT NOTE  Subjective:  Susan Conrad is a 29 y.o. G1P0000 at [redacted]w[redacted]d being seen today for ongoing prenatal care.  She is currently monitored for the following issues for this high-risk pregnancy and has Depression with anxiety; Supervision of pregnancy with other poor reproductive or obstetric history, second trimester; Encounter for supervision of low-risk pregnancy in first trimester; Tobacco use in pregnancy, antepartum, second trimester; Marijuana use during pregnancy; Poor fetal growth affecting management of mother in third trimester; and Anemia affecting pregnancy in third trimester on their problem list.  Patient reports no complaints.  Contractions: Irregular. Vag. Bleeding: None.  Movement: Present. Denies leaking of fluid.   The following portions of the patient's history were reviewed and updated as appropriate: allergies, current medications, past family history, past medical history, past social history, past surgical history and problem list.   Objective:   Vitals:   12/26/24 1438  BP: 101/64  Pulse: 80  Weight: 139 lb (63 kg)    Fetal Status:  Fetal Heart Rate (bpm): 140s Fundal Height: 31 cm Movement: Present    General: Alert, oriented and cooperative. Patient is in no acute distress.  Skin: Skin is warm and dry. No rash noted.   Cardiovascular: Normal heart rate noted  Respiratory: Normal respiratory effort, no problems with respiration noted  Abdomen: Soft, gravid, appropriate for gestational age.  Pain/Pressure: Present     Pelvic: Cervical exam deferred        Extremities: Normal range of motion.     Mental Status: Normal mood and affect. Normal behavior. Normal judgment and thought content.      07/20/2024   10:18 AM 06/14/2024   10:17 AM 05/08/2023    3:22 PM  Depression screen PHQ 2/9  Decreased Interest 2 0 3  Down, Depressed, Hopeless 1 0 3  PHQ - 2 Score 3 0 6  Altered sleeping 3  3  Tired, decreased energy 3  3  Change in appetite 0  3   Feeling bad or failure about yourself  0  3  Trouble concentrating 0  2  Moving slowly or fidgety/restless 0  1  Suicidal thoughts 0  2  PHQ-9 Score 9   23   Difficult doing work/chores Not difficult at all  Very difficult     Data saved with a previous flowsheet row definition        07/20/2024   10:18 AM 05/08/2023    3:23 PM  GAD 7 : Generalized Anxiety Score  Nervous, Anxious, on Edge 1 3  Control/stop worrying 0 3  Worry too much - different things 1 3  Trouble relaxing 1 3  Restless 0 3  Easily annoyed or irritable 1 3  Afraid - awful might happen 0 3  Total GAD 7 Score 4 21  Anxiety Difficulty Not difficult at all Very difficult    Assessment and Plan:  Pregnancy: G1P0000 at [redacted]w[redacted]d 1. Anemia affecting pregnancy in third trimester (Primary) Not on PO iron - Anemia Profile B  2. [redacted] weeks gestation of pregnancy  3. Poor fetal growth affecting management of mother in third trimester, single or unspecified fetus Had to miss her 12/31 f/u scan. I d/w her to call them today and reschedule. FH okay today 12/1: efw 14%, 1166g, ac 27%, afi 14  Preterm labor symptoms and general obstetric precautions including but not limited to vaginal bleeding, contractions, leaking of fluid and fetal movement were reviewed in detail with the patient. Please refer to  After Visit Summary for other counseling recommendations.   No follow-ups on file.  Future Appointments  Date Time Provider Department Center  01/09/2025 10:30 AM Orman Erminio POUR, PA-C [facility] None  01/09/2025  1:30 PM Izell Harari, MD [facility]    Harari Izell, MD  "

## 2024-12-27 ENCOUNTER — Ambulatory Visit: Payer: Self-pay | Admitting: Obstetrics and Gynecology

## 2024-12-27 LAB — ANEMIA PROFILE B
Basophils Absolute: 0 x10E3/uL (ref 0.0–0.2)
Basos: 0 %
EOS (ABSOLUTE): 0.1 x10E3/uL (ref 0.0–0.4)
Eos: 1 %
Ferritin: 8 ng/mL — ABNORMAL LOW (ref 15–150)
Folate: 11.3 ng/mL
Hematocrit: 30.1 % — ABNORMAL LOW (ref 34.0–46.6)
Hemoglobin: 10 g/dL — ABNORMAL LOW (ref 11.1–15.9)
Immature Grans (Abs): 0.1 x10E3/uL (ref 0.0–0.1)
Immature Granulocytes: 1 %
Iron Saturation: 6 % — CL (ref 15–55)
Iron: 28 ug/dL (ref 27–159)
Lymphocytes Absolute: 2 x10E3/uL (ref 0.7–3.1)
Lymphs: 19 %
MCH: 30.5 pg (ref 26.6–33.0)
MCHC: 33.2 g/dL (ref 31.5–35.7)
MCV: 92 fL (ref 79–97)
Monocytes Absolute: 0.6 x10E3/uL (ref 0.1–0.9)
Monocytes: 6 %
Neutrophils Absolute: 7.9 x10E3/uL — ABNORMAL HIGH (ref 1.4–7.0)
Neutrophils: 73 %
Platelets: 344 x10E3/uL (ref 150–450)
RBC: 3.28 x10E6/uL — ABNORMAL LOW (ref 3.77–5.28)
RDW: 12.8 % (ref 11.7–15.4)
Retic Ct Pct: 1.6 % (ref 0.6–2.6)
Total Iron Binding Capacity: 505 ug/dL — ABNORMAL HIGH (ref 250–450)
UIBC: 477 ug/dL — ABNORMAL HIGH (ref 131–425)
Vitamin B-12: 246 pg/mL (ref 232–1245)
WBC: 10.8 x10E3/uL (ref 3.4–10.8)

## 2024-12-29 MED ORDER — FERROUS SULFATE 324 MG PO TBEC: 324.0000 mg | DELAYED_RELEASE_TABLET | ORAL | 0 refills | Status: AC

## 2025-01-04 ENCOUNTER — Other Ambulatory Visit: Payer: Self-pay | Admitting: *Deleted

## 2025-01-04 DIAGNOSIS — O365930 Maternal care for other known or suspected poor fetal growth, third trimester, not applicable or unspecified: Secondary | ICD-10-CM

## 2025-01-09 ENCOUNTER — Encounter: Admitting: Obstetrics and Gynecology

## 2025-01-09 ENCOUNTER — Ambulatory Visit: Admitting: Physician Assistant

## 2025-01-10 ENCOUNTER — Other Ambulatory Visit: Admitting: Radiology

## 2025-01-10 DIAGNOSIS — F17200 Nicotine dependence, unspecified, uncomplicated: Secondary | ICD-10-CM

## 2025-01-10 DIAGNOSIS — O365930 Maternal care for other known or suspected poor fetal growth, third trimester, not applicable or unspecified: Secondary | ICD-10-CM

## 2025-01-10 DIAGNOSIS — O99333 Smoking (tobacco) complicating pregnancy, third trimester: Secondary | ICD-10-CM | POA: Diagnosis not present

## 2025-01-10 DIAGNOSIS — Z3A34 34 weeks gestation of pregnancy: Secondary | ICD-10-CM

## 2025-01-10 NOTE — Progress Notes (Signed)
 US : GA = 36 weeks Single active female fetus, cephalic, FHR = 148 bpm, anterior pl, gr3, AFI=13.2cm, MVP = 7 cm, EFW 2400g, 13% cervix closed

## 2025-01-18 ENCOUNTER — Ambulatory Visit: Payer: Self-pay | Admitting: Obstetrics and Gynecology

## 2025-01-19 ENCOUNTER — Other Ambulatory Visit (HOSPITAL_COMMUNITY)
Admission: RE | Admit: 2025-01-19 | Discharge: 2025-01-19 | Disposition: A | Source: Ambulatory Visit | Attending: Obstetrics and Gynecology | Admitting: Obstetrics and Gynecology

## 2025-01-19 ENCOUNTER — Ambulatory Visit (INDEPENDENT_AMBULATORY_CARE_PROVIDER_SITE_OTHER): Admitting: Obstetrics and Gynecology

## 2025-01-19 VITALS — BP 110/66 | HR 75 | Wt 140.0 lb

## 2025-01-19 DIAGNOSIS — Z3A37 37 weeks gestation of pregnancy: Secondary | ICD-10-CM

## 2025-01-19 DIAGNOSIS — O99013 Anemia complicating pregnancy, third trimester: Secondary | ICD-10-CM | POA: Diagnosis not present

## 2025-01-19 DIAGNOSIS — Z3483 Encounter for supervision of other normal pregnancy, third trimester: Secondary | ICD-10-CM | POA: Diagnosis present

## 2025-01-19 DIAGNOSIS — O365930 Maternal care for other known or suspected poor fetal growth, third trimester, not applicable or unspecified: Secondary | ICD-10-CM | POA: Diagnosis not present

## 2025-01-19 NOTE — Progress Notes (Signed)
 "  PRENATAL VISIT NOTE  Subjective:  Susan Conrad is a 28 y.o. G1P0000 at [redacted]w[redacted]d being seen today for ongoing prenatal care.  She is currently monitored for the following issues for this low-risk pregnancy and has Depression with anxiety; Supervision of pregnancy with other poor reproductive or obstetric history, second trimester; Encounter for supervision of low-risk pregnancy in first trimester; Tobacco use in pregnancy, antepartum, second trimester; Marijuana use during pregnancy; Poor fetal growth affecting management of mother in third trimester; and Anemia affecting pregnancy in third trimester on their problem list.  Patient reports no complaints.  Contractions: Irregular. Vag. Bleeding: None.  Movement: Present. Denies leaking of fluid.   The following portions of the patient's history were reviewed and updated as appropriate: allergies, current medications, past family history, past medical history, past social history, past surgical history and problem list.   Objective:   Vitals:   01/19/25 1517  BP: 110/66  Pulse: 75  Weight: 140 lb (63.5 kg)    Fetal Status:  Fetal Heart Rate (bpm): 143   Movement: Present Presentation: Vertex  General: Alert, oriented and cooperative. Patient is in no acute distress.  Skin: Skin is warm and dry. No rash noted.   Cardiovascular: Normal heart rate noted  Respiratory: Normal respiratory effort, no problems with respiration noted  Abdomen: Soft, gravid, appropriate for gestational age.  Pain/Pressure: Present     Pelvic: Cervical exam performed in the presence of a chaperone Dilation: Fingertip Effacement (%): Thick Station: -4 (Ballotable)  Extremities: Normal range of motion.     Mental Status: Normal mood and affect. Normal behavior. Normal judgment and thought content.      07/20/2024   10:18 AM 06/14/2024   10:17 AM 05/08/2023    3:22 PM  Depression screen PHQ 2/9  Decreased Interest 2 0 3  Down, Depressed, Hopeless 1 0 3  PHQ - 2  Score 3 0 6  Altered sleeping 3  3  Tired, decreased energy 3  3  Change in appetite 0  3  Feeling bad or failure about yourself  0  3  Trouble concentrating 0  2  Moving slowly or fidgety/restless 0  1  Suicidal thoughts 0  2  PHQ-9 Score 9   23   Difficult doing work/chores Not difficult at all  Very difficult     Data saved with a previous flowsheet row definition        07/20/2024   10:18 AM 05/08/2023    3:23 PM  GAD 7 : Generalized Anxiety Score  Nervous, Anxious, on Edge 1  3   Control/stop worrying 0  3   Worry too much - different things 1  3   Trouble relaxing 1  3   Restless 0  3   Easily annoyed or irritable 1  3   Afraid - awful might happen 0  3   Total GAD 7 Score 4 21  Anxiety Difficulty Not difficult at all Very difficult     Data saved with a previous flowsheet row definition    Assessment and Plan:  Pregnancy: G1P0000 at [redacted]w[redacted]d 1. [redacted] weeks gestation of pregnancy (Primary) - Strep Gp B NAA - Cervicovaginal ancillary only( [facility])  2. Poor fetal growth affecting management of mother in third trimester, single or unspecified fetus Stable EFW and good AC. Patient states FOB is on the smaller side, like her 1/20: efw 13%, 2500g, ac 23%, afi 13 12/1: efw 14%, ac 27%  3. Anemia affecting  pregnancy in third trimester Continue PO iron  Term labor symptoms and general obstetric precautions including but not limited to vaginal bleeding, contractions, leaking of fluid and fetal movement were reviewed in detail with the patient. Please refer to After Visit Summary for other counseling recommendations.   No follow-ups on file.  Future Appointments  Date Time Provider Department Center  01/24/2025  3:30 PM Herchel Gloris LABOR, MD [facility]  02/01/2025  2:30 PM Emilio Delilah HERO, CNM [facility]    Bebe Furry, MD  "

## 2025-01-21 LAB — STREP GP B NAA: Strep Gp B NAA: NEGATIVE

## 2025-01-23 LAB — CERVICOVAGINAL ANCILLARY ONLY
Chlamydia: NEGATIVE
Comment: NEGATIVE
Comment: NORMAL
Neisseria Gonorrhea: NEGATIVE

## 2025-01-24 ENCOUNTER — Encounter: Payer: Self-pay | Admitting: Obstetrics & Gynecology

## 2025-01-24 ENCOUNTER — Telehealth: Admitting: Obstetrics & Gynecology

## 2025-01-24 DIAGNOSIS — Z3A38 38 weeks gestation of pregnancy: Secondary | ICD-10-CM

## 2025-01-25 ENCOUNTER — Telehealth: Payer: Self-pay | Admitting: Obstetrics & Gynecology

## 2025-01-25 NOTE — Progress Notes (Signed)
   Patient was not available today for her scheduled virtual appointment.   Kailon Treese, MD, FACOG Obstetrician & Gynecologist, [facility], [facility]  

## 2025-01-27 ENCOUNTER — Encounter: Admitting: Obstetrics & Gynecology

## 2025-02-01 ENCOUNTER — Encounter: Admitting: Certified Nurse Midwife
